# Patient Record
Sex: Female | Born: 1970
Health system: Southern US, Community
[De-identification: ages and names within clinical notes are randomized; demographics above are authoritative.]

## PROBLEM LIST (undated history)

## (undated) DIAGNOSIS — R202 Paresthesia of skin: Secondary | ICD-10-CM

## (undated) DIAGNOSIS — M069 Rheumatoid arthritis, unspecified: Secondary | ICD-10-CM

## (undated) DIAGNOSIS — R200 Anesthesia of skin: Secondary | ICD-10-CM

## (undated) DIAGNOSIS — T753XXA Motion sickness, initial encounter: Secondary | ICD-10-CM

## (undated) DIAGNOSIS — R42 Dizziness and giddiness: Secondary | ICD-10-CM

## (undated) DIAGNOSIS — M7062 Trochanteric bursitis, left hip: Secondary | ICD-10-CM

## (undated) HISTORY — DX: Trochanteric bursitis, left hip: M70.62

## (undated) HISTORY — PX: BUNIONECTOMY: SHX129

## (undated) HISTORY — DX: Anesthesia of skin: R20.0

## (undated) HISTORY — PX: TONSILLECTOMY: SUR1361

## (undated) HISTORY — DX: Paresthesia of skin: R20.2

---
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
## 2001-07-11 HISTORY — PX: ABDOMINAL HYSTERECTOMY: SHX81

## 2012-09-29 ENCOUNTER — Emergency Department: Payer: Self-pay | Admitting: Emergency Medicine

## 2015-02-13 ENCOUNTER — Ambulatory Visit (INDEPENDENT_AMBULATORY_CARE_PROVIDER_SITE_OTHER): Payer: BLUE CROSS/BLUE SHIELD | Admitting: Family Medicine

## 2015-02-13 ENCOUNTER — Ambulatory Visit
Admission: RE | Admit: 2015-02-13 | Discharge: 2015-02-13 | Disposition: A | Discharge status: Home / Self Care | Payer: BLUE CROSS/BLUE SHIELD | Source: Ambulatory Visit | Attending: Family Medicine | Admitting: Family Medicine

## 2015-02-13 ENCOUNTER — Encounter: Payer: Self-pay | Admitting: Family Medicine

## 2015-02-13 VITALS — BP 110/80 | HR 78 | Temp 98.3°F | Resp 16 | Ht 65.0 in | Wt 150.0 lb

## 2015-02-13 DIAGNOSIS — X58XXXA Exposure to other specified factors, initial encounter: Secondary | ICD-10-CM | POA: Insufficient documentation

## 2015-02-13 DIAGNOSIS — S8992XA Unspecified injury of left lower leg, initial encounter: Secondary | ICD-10-CM

## 2015-02-13 MED ORDER — HYDROCODONE-ACETAMINOPHEN 5-325 MG PO TABS: ORAL_TABLET | ORAL | Status: DC

## 2015-02-13 NOTE — Progress Notes (Signed)
Subjective:     Patient ID: Janice Strickland, female   DOB: 10-Mar-1971, 44 y.o.   MRN: [id number]  HPI  Chief Complaint  Patient presents with  . Fall    Patient comes in office today with concerns of knee pain. Patient states this morning she went to take her puppies outside and had slipped into her husbands flip flops, when patient tried exiting house with dogs she slipped out of flip flops and landed on her knee onto the concrete. Patient reports that pain is mostly in her left knee and is radiating down into her ankle.   States she landed directly on her left knee. "I tripped over a flip-flop then I found myself on the floor." has taken ibuprofen 400 mg.for her sx.   Review of Systems     Objective:   Physical Exam  Constitutional: She appears well-developed and well-nourished. She appears distressed (moderate pain with w.b.).  Musculoskeletal:  Antalgic gait wit mild swelling over her left knee. She is tender over her patella with crepitus present. Does not tolerate further manipulation/testing.       Assessment:    1. Knee injury, left, initial encounter: r/o fracture - DG Knee Complete 4 Views Left; Future - HYDROcodone-acetaminophen (NORCO/VICODIN) 5-325 MG per tablet; One every 4-6 hours as needed for pain  Dispense: 28 tablet; Refill: 0    Plan:    Discussed use of nsaid's, icing, and further f/u pending x-ray report. Rx for crutches provided.

## 2015-02-13 NOTE — Patient Instructions (Signed)
Continue the ibuprofen 400-800 mg. 3 x day with food. Ice down your knee for 10-20 minutes several times in the next 48 hours. We will call you with the x-ray report.

## 2015-07-01 ENCOUNTER — Ambulatory Visit (INDEPENDENT_AMBULATORY_CARE_PROVIDER_SITE_OTHER): Payer: BLUE CROSS/BLUE SHIELD | Admitting: Family Medicine

## 2015-07-01 ENCOUNTER — Encounter: Payer: Self-pay | Admitting: Family Medicine

## 2015-07-01 VITALS — BP 110/68 | HR 76 | Temp 98.1°F | Resp 16 | Ht 65.0 in | Wt 159.0 lb

## 2015-07-01 DIAGNOSIS — B349 Viral infection, unspecified: Secondary | ICD-10-CM | POA: Insufficient documentation

## 2015-07-01 DIAGNOSIS — R5383 Other fatigue: Secondary | ICD-10-CM | POA: Insufficient documentation

## 2015-07-01 DIAGNOSIS — N951 Menopausal and female climacteric states: Secondary | ICD-10-CM | POA: Insufficient documentation

## 2015-07-01 DIAGNOSIS — L509 Urticaria, unspecified: Secondary | ICD-10-CM

## 2015-07-01 DIAGNOSIS — M7060 Trochanteric bursitis, unspecified hip: Secondary | ICD-10-CM | POA: Insufficient documentation

## 2015-07-01 MED ORDER — RANITIDINE HCL 150 MG PO TABS: 150.0000 mg | ORAL_TABLET | Freq: Two times a day (BID) | ORAL | Status: DC

## 2015-07-01 MED ORDER — CETIRIZINE HCL 10 MG PO TABS: 10.0000 mg | ORAL_TABLET | Freq: Every day | ORAL | Status: DC

## 2015-07-01 NOTE — Progress Notes (Signed)
Patient ID: Janice Strickland, female   DOB: [birth year], 44 y.o.   MRN: [id number]       [name] Female    DOB: Feb 21, 1971   44 y.o.   MRN: [id number] Visit Date: 07/01/2015  Today's Provider: Lorie Phenix, MD   Chief Complaint  Patient presents with  . Rash   Subjective:    HPI Rash: Patient complains of rash involving the left side of neck and chest and under left arm. Rash started 1 days ago. Appearance of rash at onset: Color of lesion(s): pink. Rash has changed over time Initial distribution: neck.  Discomfort associated with rash: is painful and is pruritic.  Associated symptoms: headache and vision change. Patient also complains of body ache and stiffnes. Denies: fever. Patient has had similar rash. Patient has not identified precipitant. Patient has not had new exposures (soaps, lotions, laundry detergents, foods, medications, plants, insects or animals.) Patient reports she did have shingles about 4 years ago.    No Known Allergies Previous Medications   No medications on file    Review of Systems  Constitutional: Positive for fatigue.  HENT: Positive for congestion.   Eyes: Positive for visual disturbance.  Musculoskeletal: Positive for myalgias.  Skin: Positive for rash.  Neurological: Positive for headaches.    Social History  Substance Use Topics  . Smoking status: Never Smoker   . Smokeless tobacco: Never Used  . Alcohol Use: No   Objective:   BP 110/68 mmHg  Pulse 76  Temp(Src) 98.1 F (36.7 C) (Oral)  Resp 16  Ht 5\' 5"  (1.651 m)  Wt 159 lb (72.122 kg)  BMI 26.46 kg/m2  SpO2 98%  LMP  (Approximate)  Physical Exam  Constitutional: She appears well-developed and well-nourished.  HENT:  Right Ear: External ear normal.  Left Ear: External ear normal.  Nose: Nose normal.  Skin: Rash noted.  Erythema around left side of neck and chest      Assessment & Plan:     1. Urticaria New problem. Will start medication as below. Continue  to monitor.  Patient instructed to call back if condition worsens or does not improve.    - cetirizine (ZYRTEC) 10 MG tablet; Take 1 tablet (10 mg total) by mouth daily.  Dispense: 30 tablet; Refill: 11 - ranitidine (ZANTAC) 150 MG tablet; Take 1 tablet (150 mg total) by mouth 2 (two) times daily.  Dispense: 30 tablet; Refill: 0     Patient was seen and examined by , MD, and note scribed by Leo Grosser, CMA.   I have reviewed the document for accuracy and completeness and I agree with above. - Rondel Baton, MD  Leo Grosser, MD  [facility]

## 2016-09-29 ENCOUNTER — Encounter: Payer: Self-pay | Admitting: Advanced Practice Midwife

## 2016-09-29 ENCOUNTER — Ambulatory Visit (INDEPENDENT_AMBULATORY_CARE_PROVIDER_SITE_OTHER): Payer: BLUE CROSS/BLUE SHIELD | Admitting: Advanced Practice Midwife

## 2016-09-29 VITALS — BP 122/80 | HR 91 | Ht 64.0 in | Wt 162.0 lb

## 2016-09-29 DIAGNOSIS — Z01419 Encounter for gynecological examination (general) (routine) without abnormal findings: Secondary | ICD-10-CM

## 2016-09-29 DIAGNOSIS — R635 Abnormal weight gain: Secondary | ICD-10-CM

## 2016-09-29 DIAGNOSIS — Z124 Encounter for screening for malignant neoplasm of cervix: Secondary | ICD-10-CM | POA: Diagnosis not present

## 2016-09-29 DIAGNOSIS — R5383 Other fatigue: Secondary | ICD-10-CM

## 2016-09-29 NOTE — Progress Notes (Addendum)
Patient ID: Janice Strickland, female   DOB: Sep 12, 1970, 46 y.o.   MRN: [id number]     Gynecology Annual Exam  PCP: Mila Merry, MD  Chief Complaint:  Chief Complaint  Patient presents with  . Gynecologic Exam     weight gain/can't concentrate    History of Present Illness: Patient is a 46 y.o. G3P3003 presents for annual exam. The patient has complaints of weight gain over the past year, decreased concentration and decreased libido.   LMP: No LMP recorded. Patient has had a hysterectomy. Average Interval: NA Postcoital Bleeding: no  The patient is sexually active. She currently uses None for contraception. She admits to dyspareunia/vaginal dryness.  She admits occasional hot flashes. The patient does perform self breast exams.  There is no notable family history of breast or ovarian cancer in her family.  The patient wears seatbelts: yes.   The patient has regular exercise: no.    The patient denies current symptoms of depression.    Review of Systems: ROS  Past Medical History:  Past Medical History:  Diagnosis Date  . Trochanteric bursitis of left hip     Past Surgical History:  Past Surgical History:  Procedure Laterality Date  . ABDOMINAL HYSTERECTOMY  2003    Gynecologic History:  No LMP recorded. Patient has had a hysterectomy. Contraception: status post hysterectomy Last Pap: Results were: no abnormalities  Last mammogram: 2015 Results were: normal Obstetric History: G3P3003  Family History:  Family History  Problem Relation Age of Onset  . Hypertension Mother   . Hypothyroidism Mother   . Heart disease Mother   . Healthy Father   . Hypertension Brother     Social History:  Social History   Social History  . Marital status: Married    Spouse name: N/A  . Number of children: N/A  . Years of education: N/A   Occupational History  . Not on file.   Social History Main Topics  . Smoking status: Never Smoker  . Smokeless tobacco: Never Used    . Alcohol use No  . Drug use: No  . Sexual activity: Yes    Birth control/ protection: Surgical     Comment: Hysterectomy   Other Topics Concern  . Not on file   Social History Narrative  . No narrative on file    Allergies:  No Known Allergies  Medications: Prior to Admission medications   Not on File    Physical Exam Vitals: Blood pressure 122/80, pulse 91, height 5\' 4"  (1.626 m), weight 162 lb (73.5 kg).  General: NAD HEENT: normocephalic, anicteric Thyroid: no enlargement, no palpable nodules Pulmonary: No increased work of breathing, CTAB Cardiovascular: RRR, distal pulses 2+ Breast: Breast symmetrical, no tenderness, no palpable nodules or masses, no skin or nipple retraction present, no nipple discharge.  No axillary or supraclavicular lymphadenopathy. Abdomen: NABS, soft, non-tender, non-distended.  Umbilicus without lesions.  No hepatomegaly, splenomegaly or masses palpable. No evidence of hernia  Genitourinary:  External: Normal external female genitalia.  Normal urethral meatus, normal  Bartholin's and Skene's glands.    Vagina: Normal vaginal mucosa, no evidence of prolapse.    Cervix: S/P hysterectomy  Uterus: S/P hysterectomy  Adnexa: ovaries non-enlarged, no adnexal masses  Rectal: deferred  Lymphatic: no evidence of inguinal lymphadenopathy Extremities: no edema, erythema, or tenderness Neurologic: Grossly intact Psychiatric: mood appropriate, affect full    Assessment: 46 y.o. 46 well woman with PAP smear   Plan: Problem List Items Addressed This Visit  Other   Fatigue   Relevant Orders   TSH + free T4 (Completed)    Other Visit Diagnoses    Encounter for gynecological examination without abnormal finding    -  Primary   Relevant Orders   Pap IG (Image Guided)   TSH + free T4 (Completed)   Weight gain       Relevant Orders   TSH + free T4 (Completed)   Papanicolaou smear       Relevant Orders   Pap IG (Image Guided)       1) Mammogram - recommend yearly screening mammogram.    2) Thyroid screening today due to patient symptoms and family history  3) Discussed s/s of menopause and options for treatment  4) Hysterectomy PAP done today  5) Healthy lifestyle encouraged diet and increased exercise  6) Return to clinic in 1 year for annual gyn exam   Tresea Mall, CNM

## 2016-09-29 NOTE — Patient Instructions (Signed)
Menopause Menopause is the normal time of life when menstrual periods stop completely. Menopause is complete when you have missed 12 consecutive menstrual periods. It usually occurs between the ages of 48 years and 55 years. Very rarely does a woman develop menopause before the age of 40 years. At menopause, your ovaries stop producing the female hormones estrogen and progesterone. This can cause undesirable symptoms and also affect your health. Sometimes the symptoms may occur 4-5 years before the menopause begins. There is no relationship between menopause and:  Oral contraceptives.  Number of children you had.  Race.  The age your menstrual periods started (menarche).  Heavy smokers and very thin women may develop menopause earlier in life. What are the causes?  The ovaries stop producing the female hormones estrogen and progesterone. Other causes include:  Surgery to remove both ovaries.  The ovaries stop functioning for no known reason.  Tumors of the pituitary gland in the brain.  Medical disease that affects the ovaries and hormone production.  Radiation treatment to the abdomen or pelvis.  Chemotherapy that affects the ovaries.  What are the signs or symptoms?  Hot flashes.  Night sweats.  Decrease in sex drive.  Vaginal dryness and thinning of the vagina causing painful intercourse.  Dryness of the skin and developing wrinkles.  Headaches.  Tiredness.  Irritability.  Memory problems.  Weight gain.  Bladder infections.  Hair growth of the face and chest.  Infertility. More serious symptoms include:  Loss of bone (osteoporosis) causing breaks (fractures).  Depression.  Hardening and narrowing of the arteries (atherosclerosis) causing heart attacks and strokes.  How is this diagnosed?  When the menstrual periods have stopped for 12 straight months.  Physical exam.  Hormone studies of the blood. How is this treated? There are many treatment  choices and nearly as many questions about them. The decisions to treat or not to treat menopausal changes is an individual choice made with your health care provider. Your health care provider can discuss the treatments with you. Together, you can decide which treatment will work best for you. Your treatment choices may include:  Hormone therapy (estrogen and progesterone).  Non-hormonal medicines.  Treating the individual symptoms with medicine (for example antidepressants for depression).  Herbal medicines that may help specific symptoms.  Counseling by a psychiatrist or psychologist.  Group therapy.  Lifestyle changes including: ? Eating healthy. ? Regular exercise. ? Limiting caffeine and alcohol. ? Stress management and meditation.  No treatment.  Follow these instructions at home:  Take the medicine your health care provider gives you as directed.  Get plenty of sleep and rest.  Exercise regularly.  Eat a diet that contains calcium (good for the bones) and soy products (acts like estrogen hormone).  Avoid alcoholic beverages.  Do not smoke.  If you have hot flashes, dress in layers.  Take supplements, calcium, and vitamin D to strengthen bones.  You can use over-the-counter lubricants or moisturizers for vaginal dryness.  Group therapy is sometimes very helpful.  Acupuncture may be helpful in some cases. Contact a health care provider if:  You are not sure you are in menopause.  You are having menopausal symptoms and need advice and treatment.  You are still having menstrual periods after age 55 years.  You have pain with intercourse.  Menopause is complete (no menstrual period for 12 months) and you develop vaginal bleeding.  You need a referral to a specialist (gynecologist, psychiatrist, or psychologist) for treatment. Get help right   away if:  You have severe depression.  You have excessive vaginal bleeding.  You fell and think you have a  broken bone.  You have pain when you urinate.  You develop leg or chest pain.  You have a fast pounding heart beat (palpitations).  You have severe headaches.  You develop vision problems.  You feel a lump in your breast.  You have abdominal pain or severe indigestion. This information is not intended to replace advice given to you by your health care provider. Make sure you discuss any questions you have with your health care provider. Document Released: 09/17/2003 Document Revised: 12/03/2015 Document Reviewed: 01/24/2013 Elsevier Interactive Patient Education  2017 Elsevier Inc.  

## 2016-09-30 LAB — PAP IG (IMAGE GUIDED): PAP Smear Comment: 0

## 2016-09-30 LAB — TSH+FREE T4
Free T4: 1.13 ng/dL (ref 0.82–1.77)
TSH: 2.1 u[IU]/mL (ref 0.450–4.500)

## 2016-11-21 ENCOUNTER — Ambulatory Visit (INDEPENDENT_AMBULATORY_CARE_PROVIDER_SITE_OTHER): Payer: BLUE CROSS/BLUE SHIELD | Admitting: Physician Assistant

## 2016-11-21 ENCOUNTER — Encounter: Payer: Self-pay | Admitting: Physician Assistant

## 2016-11-21 VITALS — BP 122/80 | HR 76 | Temp 98.1°F | Resp 16 | Wt 164.0 lb

## 2016-11-21 DIAGNOSIS — M654 Radial styloid tenosynovitis [de Quervain]: Secondary | ICD-10-CM | POA: Diagnosis not present

## 2016-11-21 DIAGNOSIS — L237 Allergic contact dermatitis due to plants, except food: Secondary | ICD-10-CM | POA: Diagnosis not present

## 2016-11-21 DIAGNOSIS — M25521 Pain in right elbow: Secondary | ICD-10-CM | POA: Diagnosis not present

## 2016-11-21 MED ORDER — TRIAMCINOLONE ACETONIDE 0.1 % EX CREA: 1.0000 "application " | TOPICAL_CREAM | Freq: Two times a day (BID) | CUTANEOUS | 0 refills | Status: DC

## 2016-11-21 NOTE — Progress Notes (Signed)
[name] Female    DOB: [birth year]   46 y.o.   MRN: [id number] Visit Date: 11/21/2016  Today's Provider: Trey Sailors, PA-C   Chief Complaint  Patient presents with  . Rash    Started last week.  . Elbow Pain    Right Elbow pain also started last week.    Subjective:    Rash  This is a new problem. The current episode started 1 to 4 weeks ago. The problem has been gradually worsening since onset. The rash is diffuse. The rash is characterized by itchiness and redness. She was exposed to plant contact.  Arm Pain   The incident occurred more than 1 week ago. The incident occurred at home. There was no injury mechanism. The pain is present in the right elbow. The quality of the pain is described as aching. The pain radiates to the right arm. The pain is at a severity of 3/10. The pain is mild. The pain has been constant since the incident. Associated symptoms include muscle weakness. Pertinent negatives include no chest pain or numbness. Nothing aggravates the symptoms. She has tried acetaminophen for the symptoms. The treatment provided mild relief.       No Known Allergies  No current outpatient prescriptions on file.  Review of Systems  Cardiovascular: Negative for chest pain.  Musculoskeletal: Positive for arthralgias and myalgias. Negative for back pain, gait problem, joint swelling, neck pain and neck stiffness.  Skin: Positive for rash. Negative for color change, pallor and wound.  Neurological: Positive for weakness. Negative for dizziness, numbness and headaches.    Social History  Substance Use Topics  . Smoking status: Never Smoker  . Smokeless tobacco: Never Used  . Alcohol use No   Objective:   BP 122/80 (BP Location: Left Arm, Patient Position: Sitting, Cuff Size: Normal)   Pulse 76   Temp 98.1 F (36.7 C) (Oral)   Resp 16   Wt 164 lb (74.4 kg)   BMI 28.15 kg/m  Vitals:   11/21/16 1345  BP: 122/80  Pulse: 76  Resp: 16  Temp:  98.1 F (36.7 C)  TempSrc: Oral  Weight: 164 lb (74.4 kg)     Physical Exam  Constitutional: She is oriented to person, place, and time. She appears well-developed and well-nourished.  Cardiovascular: Normal rate and regular rhythm.   Pulmonary/Chest: Effort normal and breath sounds normal.  Musculoskeletal: Normal range of motion. She exhibits tenderness. She exhibits no edema or deformity.  Grip strength 4/5 on right, 5/5 on left. Positive Finkelstein's on right. Some TTP along right radial styloid and proximally. Some TTP along lateral right forearm. Full ROM.   Neurological: She is alert and oriented to person, place, and time.  Skin: Skin is warm and dry. Rash noted. There is erythema.  Vesicular lesions and erythema scattered on extremities and trunk. No facial or ocular involvement.        Assessment & Plan:     1. Allergic contact dermatitis due to plants, except food  Do not apply to face or genitals.  - triamcinolone cream (KENALOG) 0.1 %; Apply 1 application topically 2 (two) times daily.  Dispense: 30 g; Refill: 0  2. Right elbow pain  Seems like tendonitis, patient recently doing yard work. Should rest, use ICE/NSAIDs.  3. De Quervain's disease (tenosynovitis)  Talked about rest, ice, NSAIDs. Also talked about thumb spica splint and ortho referral if pain persists.  Return if symptoms worsen  or fail to improve.  The entirety of the information documented in the History of Present Illness, Review of Systems and Physical Exam were personally obtained by me. Portions of this information were initially documented by Kavin Leech, CMA and reviewed by me for thoroughness and accuracy.        Trey Sailors, PA-C  [facility]

## 2016-11-21 NOTE — Patient Instructions (Signed)
Poison Oak Dermatitis Poison oak dermatitis is redness and soreness (inflammation) of the skin. It is caused by a chemical that is on the leaves of the poison oak plant. You may also have itching, a rash, and blisters. Symptoms often clear up in 1-2 weeks. You may get this condition by touching a poison oak plant. You can also get it by touching something that has the chemical on it. This may include animals or objects that have come in contact with the plant. Follow these instructions at home: General instructions   Take or apply over-the-counter and prescription medicines only as told by your doctor.  If you touch poison oak, wash your skin with soap and cold water right away.  Use hydrocortisone creams or calamine lotion as needed to help with itching.  Take oatmeal baths as needed. Use colloidal oatmeal. You can get this at a pharmacy or grocery store. Follow the instructions on the package.  Do not scratch or rub your skin.  While you have the rash, wash your clothes right after you wear them. Prevention    Know what poison oak looks like so you can avoid it. This plant has three leaves with flowering branches on a single stem. The leaves are fuzzy. They have a toothlike edge.  If you have touched poison oak, wash with soap and water right away. Be sure to wash under your fingernails.  When hiking or camping, wear long pants, a long-sleeved shirt, tall socks, and hiking boots. You can also use a lotion on your skin that helps to prevent contact with the chemical on the plant.  If you think that your clothes or outdoor gear came in contact with poison oak, rinse them off with a garden hose before you bring them inside your house. Contact a doctor if:  You have open sores in the rash area.  You have more redness, swelling, or pain in the affected area.  You have redness that spreads beyond the rash area.  You have fluid, blood, or pus coming from the affected area.  You have a  fever.  You have a rash over a large area of your body.  You have a rash on your eyes, mouth, or genitals.  Your rash does not improve after a few days. Get help right away if:  Your face swells or your eyes swell shut.  You have trouble breathing.  You have trouble swallowing. This information is not intended to replace advice given to you by your health care provider. Make sure you discuss any questions you have with your health care provider. Document Released: 07/30/2010 Document Revised: 12/03/2015 Document Reviewed: 12/03/2014 Elsevier Interactive Patient Education  2017 Elsevier Inc.  

## 2018-02-20 ENCOUNTER — Ambulatory Visit (INDEPENDENT_AMBULATORY_CARE_PROVIDER_SITE_OTHER): Payer: Commercial Managed Care - PPO | Admitting: Obstetrics and Gynecology

## 2018-02-20 ENCOUNTER — Encounter: Payer: Self-pay | Admitting: Obstetrics and Gynecology

## 2018-02-20 VITALS — BP 110/80 | HR 75 | Ht 64.0 in | Wt 151.0 lb

## 2018-02-20 DIAGNOSIS — N951 Menopausal and female climacteric states: Secondary | ICD-10-CM

## 2018-02-20 DIAGNOSIS — Z1239 Encounter for other screening for malignant neoplasm of breast: Secondary | ICD-10-CM

## 2018-02-20 DIAGNOSIS — F411 Generalized anxiety disorder: Secondary | ICD-10-CM | POA: Diagnosis not present

## 2018-02-20 DIAGNOSIS — Z01419 Encounter for gynecological examination (general) (routine) without abnormal findings: Secondary | ICD-10-CM | POA: Diagnosis not present

## 2018-02-20 DIAGNOSIS — Z1231 Encounter for screening mammogram for malignant neoplasm of breast: Secondary | ICD-10-CM

## 2018-02-20 MED ORDER — HYDROXYZINE HCL 25 MG PO TABS: 25.0000 mg | ORAL_TABLET | Freq: Four times a day (QID) | ORAL | 2 refills | Status: DC | PRN

## 2018-02-20 MED ORDER — ESCITALOPRAM OXALATE 10 MG PO TABS: 10.0000 mg | ORAL_TABLET | Freq: Every day | ORAL | 2 refills | Status: DC

## 2018-02-20 NOTE — Patient Instructions (Signed)
[facility] [address] [place] [address]  [facility] [place]  [address] [place]  [address]  Phone: [phone]  We discussed WHI study findings in detail.  In the combined estrogen-progesterone arm breast cancer risk was increased by 1.26 (CI of 1.00 to 1.59), coronary heart disease 1.29 (CI 1.02-1.63), stroke risk 1.41 (1.07-1.85), and pulmonary embolism 2.13 (CI 1.39-3.25).  That being said the while statistically significant the actual number of cases attributable are relatively small at an addition 8 cases of breast cancer, 7 more coronary artery event, 8 more strokes, and 8 additional case of pulmonary embolism per 10,000 women.  Study was terminated because of the increased breast cancer risk, this was not seen in the progestin only arm of the study for women without an intact uterus.  In addition it is important to note that HRT also had positive or risk reducing effects, and all cause mortality between the HRT/non-HRT users is not statistically different.  Estrogen-progestin HRT decreased the relative risk of hip fracture 0.66 (CI 0.45-0.98), colorectal cancer 0.63 (0.43-0.92).  Current consensus is to limit dose to the lowest effective dose, and shortest treatment duration possible.  Breast cancer risk appeared to increase after 4 years of use.  Also important to note is that these risk refer to systemic HRT for the treatment of vasomotor symptoms, and do not apply to vaginal preperations with minimal systemic absorption and aimed at treating symptoms of vulvovaginal atrophy.    We briefly touched on findings of WHIMS trial published in 2005 which looked at women 65 year of age or older, and whether HRT was protective against the development of dementia.  The study revealed that HRT actually increased the risk for the development of dementia but was limited by looking only at patients 65 years of age and older.  The subsequent KEEPS trial  In 2012 which  looked at HRT in recently postmenopausal women did not show any improvement in cognitive function for women on HRT.  However, there was also no significant cognitive declines seen in recently postmenopausal women receiving HRT as had previously been shown in the WHIMS trial.    

## 2018-02-20 NOTE — Progress Notes (Signed)
Gynecology Annual Exam  PCP: Malva Limes, MD  Chief Complaint:  Chief Complaint  Patient presents with  . Gynecologic Exam    Menopausal S&S  . Anxiety    History of Present Illness: Patient is a 47 y.o. G3P3003 presents for annual exam. The patient has no complaints today.   LMP: No LMP recorded. Patient has had a hysterectomy.  The patient is sexually active. She currently uses status post hysterectomy for contraception. She denies dyspareunia.  The patient does perform self breast exams.  There is no notable family history of breast or ovarian cancer in her family.  The patient wears seatbelts: no.   The patient has regular exercise: not asked.    The patient reports current symptoms of anxiety.    The patient is a 47 y.o. female presenting initial evaluation for symptoms of anxiety and depression.  The patient is currently taking nothing for the management of her symptoms.  She has not had any recent situational stressors.  She reports symptoms of anhedonia, day time somnolence, insomnia, irritability and social anxiety.  She denies risk taking behavior, agorophobia, feelings of guilt, feelings of worthlessness, suicidal ideation, homicidal ideation, auditory hallucinations and visual hallucinations.    The patient does not have a pre-existing history of depression and anxiety.  She  does not a prior history of suicide attempts.   Review of Systems: Review of Systems  Constitutional: Negative for chills and fever.  HENT: Negative for congestion.   Respiratory: Negative for cough and shortness of breath.   Cardiovascular: Negative for chest pain and palpitations.  Gastrointestinal: Negative for abdominal pain, constipation, diarrhea, heartburn, nausea and vomiting.  Genitourinary: Negative for dysuria, frequency and urgency.  Skin: Negative for itching and rash.  Neurological: Negative for dizziness and headaches.  Endo/Heme/Allergies: Negative for polydipsia.    Psychiatric/Behavioral: Negative for depression, hallucinations, memory loss, substance abuse and suicidal ideas. The patient is nervous/anxious and has insomnia.     Past Medical History:  Past Medical History:  Diagnosis Date  . Trochanteric bursitis of left hip     Past Surgical History:  Past Surgical History:  Procedure Laterality Date  . ABDOMINAL HYSTERECTOMY  2003    Gynecologic History:  No LMP recorded. Patient has had a hysterectomy. Contraception: status post hysterectomy Last Pap: Results were: N/A  Obstetric History: G3P3003  Family History:  Family History  Problem Relation Age of Onset  . Hypertension Mother   . Hypothyroidism Mother   . Heart disease Mother   . Healthy Father   . Hypertension Brother     Social History:  Social History   Socioeconomic History  . Marital status: Married    Spouse name: Not on file  . Number of children: Not on file  . Years of education: Not on file  . Highest education level: Not on file  Occupational History  . Not on file  Social Needs  . Financial resource strain: Not on file  . Food insecurity:    Worry: Not on file    Inability: Not on file  . Transportation needs:    Medical: Not on file    Non-medical: Not on file  Tobacco Use  . Smoking status: Never Smoker  . Smokeless tobacco: Never Used  Substance and Sexual Activity  . Alcohol use: No    Alcohol/week: 0.0 standard drinks  . Drug use: No  . Sexual activity: Yes    Birth control/protection: Surgical    Comment: Hysterectomy  Lifestyle  . Physical activity:    Days per week: Not on file    Minutes per session: Not on file  . Stress: Not on file  Relationships  . Social connections:    Talks on phone: Not on file    Gets together: Not on file    Attends religious service: Not on file    Active member of club or organization: Not on file    Attends meetings of clubs or organizations: Not on file    Relationship status: Not on file  .  Intimate partner violence:    Fear of current or ex partner: Not on file    Emotionally abused: Not on file    Physically abused: Not on file    Forced sexual activity: Not on file  Other Topics Concern  . Not on file  Social History Narrative  . Not on file    Allergies:  No Known Allergies  Medications: Prior to Admission medications   Not on File    Physical Exam Vitals: Blood pressure 110/80, pulse 75, height 5\' 4"  (1.626 m), weight 151 lb (68.5 kg).  General: NAD HEENT: normocephalic, anicteric Thyroid: no enlargement, no palpable nodules Pulmonary: No increased work of breathing, CTAB Cardiovascular: RRR, distal pulses 2+ Breast: Breast symmetrical, no tenderness, no palpable nodules or masses, no skin or nipple retraction present, no nipple discharge.  No axillary or supraclavicular lymphadenopathy. Abdomen: NABS, soft, non-tender, non-distended.  Umbilicus without lesions.  No hepatomegaly, splenomegaly or masses palpable. No evidence of hernia  Genitourinary:  External: Normal external female genitalia.  Normal urethral meatus, normal Bartholin's and Skene's glands.    Vagina: Normal vaginal mucosa, no evidence of prolapse.    Cervix: surgically absent  Uterus: surgically absent  Adnexa: ovaries non-enlarged, no adnexal masses  Rectal: deferred  Lymphatic: no evidence of inguinal lymphadenopathy Extremities: no edema, erythema, or tenderness Neurologic: Grossly intact Psychiatric: mood appropriate, affect full  Female chaperone present for pelvic and breast  portions of the physical exam  GAD 7 : Generalized Anxiety Score 02/20/2018  Nervous, Anxious, on Edge 2  Control/stop worrying 2  Worry too much - different things 1  Trouble relaxing 2  Restless 2  Easily annoyed or irritable 3  Afraid - awful might happen 3  Total GAD 7 Score 15  Anxiety Difficulty Somewhat difficult        Assessment: 47 y.o. G3P3003 routine annual exam  Plan: Problem List  Items Addressed This Visit    None    Visit Diagnoses    Encounter for gynecological examination without abnormal finding    -  Primary   Relevant Orders   Follicle stimulating hormone (Completed)   Estradiol (Completed)   Breast screening       Relevant Orders   MM 3D SCREEN BREAST BILATERAL   Perimenopausal vasomotor symptoms       Relevant Orders   Follicle stimulating hormone (Completed)   Estradiol (Completed)   Generalized anxiety disorder       Relevant Medications   escitalopram (LEXAPRO) 10 MG tablet   hydrOXYzine (ATARAX/VISTARIL) 25 MG tablet      1) Mammogram - recommend yearly screening mammogram.  Mammogram Was ordered today   2) STI screening  was notoffered and therefore not obtained  3) ASCCP guidelines and rational discussed.  Patient opts for discontinue secondary to prior hysterectomy screening interval  4) Contraception - the patient is currently using  status post hysterectomy.  She is not currently in  need of contraception secondary to being sterile  5) Colonoscopy - at age 73  6) Routine healthcare maintenance including cholesterol, diabetes screening discussed managed by PCP  7) Anxiety/Depression - start lexapro 10mg  and hydroxyzine 25mg  prn  8)Vasomotor symptoms - discussed average age of menopause 54.  Given prior hysterectomy unclear menopausal status.  We discussed that menopause may have mood manifestation for some individuals.  Will check FSH/LH.  If in the menopausal range we discussed HRT.  We discussed WHI study findings in detail.  In the combined estrogen-progesterone arm breast cancer risk was increased by 1.26 (CI of 1.00 to 1.59), coronary heart disease 1.29 (CI 1.02-1.63), stroke risk 1.41 (1.07-1.85), and pulmonary embolism 2.13 (CI 1.39-3.25).  That being said the while statistically significant the actual number of cases attributable are relatively small at an addition 8 cases of breast cancer, 7 more coronary artery event, 8 more  strokes, and 8 additional case of pulmonary embolism per 10,000 women.  Study was terminated because of the increased breast cancer risk, this was not seen in the progestin only arm of the study for women without an intact uterus.  In addition it is important to note that HRT also had positive or risk reducing effects, and all cause mortality between the HRT/non-HRT users is not statistically different.  Estrogen-progestin HRT decreased the relative risk of hip fracture 0.66 (CI 0.45-0.98), colorectal cancer 0.63 (0.43-0.92).  Current consensus is to limit dose to the lowest effective dose, and shortest treatment duration possible.  Breast cancer risk appeared to increase after 4 years of use.  Also important to note is that these risk refer to systemic HRT for the treatment of vasomotor symptoms, and do not apply to vaginal preperations with minimal systemic absorption and aimed at treating symptoms of vulvovaginal atrophy.    We briefly touched on findings of WHIMS trial published in 2005 which looked at women 8 year of age or older, and whether HRT was protective against the development of dementia.  The study revealed that HRT actually increased the risk for the development of dementia but was limited by looking only at patients 21 years of age and older.  The subsequent KEEPS trial  In 2012 which looked at HRT in recently postmenopausal women did not show any improvement in cognitive function for women on HRT.  However, there was also no significant cognitive declines seen in recently postmenopausal women receiving HRT as had previously been shown in the [facility] trial.    A total of 15 minutes were spent in face-to-face contact with the patient during this encounter with over half of that time devoted to counseling and coordination of care.   9) Return in about 4 weeks (around 03/20/2018) for medication follow up.   [facility], MD, 05/20/2018 OB/GYN, [facility]

## 2018-02-21 LAB — ESTRADIOL: Estradiol: 24.1 pg/mL

## 2018-02-21 LAB — FOLLICLE STIMULATING HORMONE: FSH: 116.4 m[IU]/mL

## 2018-02-23 ENCOUNTER — Ambulatory Visit
Admission: RE | Admit: 2018-02-23 | Discharge: 2018-02-23 | Disposition: A | Discharge status: Home / Self Care | Payer: Commercial Managed Care - PPO | Source: Ambulatory Visit | Attending: Obstetrics and Gynecology | Admitting: Obstetrics and Gynecology

## 2018-02-23 DIAGNOSIS — Z1239 Encounter for other screening for malignant neoplasm of breast: Secondary | ICD-10-CM

## 2018-02-23 DIAGNOSIS — Z1231 Encounter for screening mammogram for malignant neoplasm of breast: Secondary | ICD-10-CM | POA: Insufficient documentation

## 2018-02-26 ENCOUNTER — Other Ambulatory Visit: Payer: Self-pay | Admitting: Obstetrics and Gynecology

## 2018-02-26 MED ORDER — ESTROGENS CONJUGATED 0.625 MG PO TABS: 0.6250 mg | ORAL_TABLET | Freq: Every day | ORAL | 11 refills | Status: DC

## 2018-02-26 NOTE — Progress Notes (Signed)
Rx premarin po for vasomotor symptoms

## 2018-03-14 ENCOUNTER — Other Ambulatory Visit: Payer: Self-pay | Admitting: Obstetrics and Gynecology

## 2018-03-20 ENCOUNTER — Other Ambulatory Visit: Payer: Self-pay | Admitting: Obstetrics and Gynecology

## 2018-03-22 ENCOUNTER — Encounter: Payer: Self-pay | Admitting: Obstetrics and Gynecology

## 2018-03-22 ENCOUNTER — Ambulatory Visit (INDEPENDENT_AMBULATORY_CARE_PROVIDER_SITE_OTHER): Payer: Commercial Managed Care - PPO | Admitting: Obstetrics and Gynecology

## 2018-03-22 VITALS — BP 120/82 | HR 77 | Ht 64.0 in | Wt 153.0 lb

## 2018-03-22 DIAGNOSIS — N951 Menopausal and female climacteric states: Secondary | ICD-10-CM

## 2018-03-22 DIAGNOSIS — F411 Generalized anxiety disorder: Secondary | ICD-10-CM

## 2018-03-22 MED ORDER — ESCITALOPRAM OXALATE 20 MG PO TABS: 20.0000 mg | ORAL_TABLET | Freq: Every day | ORAL | 2 refills | Status: DC

## 2018-03-22 NOTE — Progress Notes (Signed)
Obstetrics & Gynecology Office Visit   Chief Complaint:  Chief Complaint  Patient presents with  . Follow-up    Medication Follow up    History of Present Illness: The patient is a 47 y.o. female presenting follow up for symptoms of anxiety.  The patient is currently taking lexapro 10mg  po daily for the management of her symptoms.  She has not had any recent situational stressors.  She reports symptoms of insomnia, irritability, social anxiety and agorophobia.  She denies suicidal ideation, homicidal ideation, auditory hallucinations and visual hallucinations. Symptoms have improved since last visit.     The patient does not have a pre-existing history of depression and anxiety.  She  does not a prior history of suicide attempts.   She was also started on premarin po last visit.  She states she has noted good overall improvement in vasomotor symptoms since starting premarin.    Review of Systems: Review of Systems  Constitutional: Negative.   Gastrointestinal: Negative for abdominal pain and nausea.  Neurological: Negative for dizziness.  Psychiatric/Behavioral: Negative for depression, hallucinations, substance abuse and suicidal ideas. The patient is nervous/anxious.     Past Medical History:  Past Medical History:  Diagnosis Date  . Trochanteric bursitis of left hip     Past Surgical History:  Past Surgical History:  Procedure Laterality Date  . ABDOMINAL HYSTERECTOMY  2003    Gynecologic History: No LMP recorded. Patient has had a hysterectomy.  Obstetric History: 2004  Family History:  Family History  Problem Relation Age of Onset  . Hypertension Mother   . Hypothyroidism Mother   . Heart disease Mother   . Healthy Father   . Hypertension Brother   . Breast cancer Neg Hx     Social History:  Social History   Socioeconomic History  . Marital status: Married    Spouse name: Not on file  . Number of children: Not on file  . Years of education: Not  on file  . Highest education level: Not on file  Occupational History  . Not on file  Social Needs  . Financial resource strain: Not on file  . Food insecurity:    Worry: Not on file    Inability: Not on file  . Transportation needs:    Medical: Not on file    Non-medical: Not on file  Tobacco Use  . Smoking status: Never Smoker  . Smokeless tobacco: Never Used  Substance and Sexual Activity  . Alcohol use: No    Alcohol/week: 0.0 standard drinks  . Drug use: No  . Sexual activity: Yes    Birth control/protection: Surgical    Comment: Hysterectomy  Lifestyle  . Physical activity:    Days per week: Not on file    Minutes per session: Not on file  . Stress: Not on file  Relationships  . Social connections:    Talks on phone: Not on file    Gets together: Not on file    Attends religious service: Not on file    Active member of club or organization: Not on file    Attends meetings of clubs or organizations: Not on file    Relationship status: Not on file  . Intimate partner violence:    Fear of current or ex partner: Not on file    Emotionally abused: Not on file    Physically abused: Not on file    Forced sexual activity: Not on file  Other Topics Concern  .  Not on file  Social History Narrative  . Not on file    Allergies:  No Known Allergies  Medications: Prior to Admission medications   Medication Sig Start Date End Date Taking? Authorizing Provider  escitalopram (LEXAPRO) 20 MG tablet Take 1 tablet (20 mg total) by mouth daily. 03/22/18  Yes Vena Austria, MD  hydrOXYzine (ATARAX/VISTARIL) 25 MG tablet Take 1 tablet (25 mg total) by mouth every 6 (six) hours as needed for anxiety. 02/20/18  Yes Vena Austria, MD  PREMARIN 0.625 MG tablet TAKE 1 TABLET (0.625 MG TOTAL) BY MOUTH DAILY. 03/20/18  Yes Vena Austria, MD    Physical Exam Vitals:  Vitals:   03/22/18 1004  BP: 120/82  Pulse: 77   No LMP recorded. Patient has had a  hysterectomy.  General: NAD HEENT: normocephalic, anicteric Pulmonary: No increased work of breathing Neurologic: Grossly intact Psychiatric: mood appropriate, affect full    GAD 7 : Generalized Anxiety Score 03/22/2018 02/20/2018  Nervous, Anxious, on Edge 3 2  Control/stop worrying 3 2  Worry too much - different things 3 1  Trouble relaxing 3 2  Restless 2 2  Easily annoyed or irritable 3 3  Afraid - awful might happen 3 3  Total GAD 7 Score 20 15  Anxiety Difficulty Somewhat difficult Somewhat difficult    Depression screen PHQ 2/9 03/22/2018  Decreased Interest 1  Down, Depressed, Hopeless 1  PHQ - 2 Score 2  Altered sleeping 1  Tired, decreased energy 1  Change in appetite 1  Feeling bad or failure about yourself  0  Trouble concentrating 2  Moving slowly or fidgety/restless 2  Suicidal thoughts 0  PHQ-9 Score 9  Difficult doing work/chores Somewhat difficult    Depression screen PHQ 2/9 03/22/2018  Decreased Interest 1  Down, Depressed, Hopeless 1  PHQ - 2 Score 2  Altered sleeping 1  Tired, decreased energy 1  Change in appetite 1  Feeling bad or failure about yourself  0  Trouble concentrating 2  Moving slowly or fidgety/restless 2  Suicidal thoughts 0  PHQ-9 Score 9  Difficult doing work/chores Somewhat difficult    Assessment: 47 y.o. [id number] presenting for medication follow up anxiety and vasomotor symptoms  Plan: Problem List Items Addressed This Visit    None      1) Anxiety - still not well controlled, will increase Lexapro 20 mg daily.  Depending on results consider adding Buspar  -GAD-7 is 20 -PHQ-9 is 9 item 9 is 0  2) Vasomotor symptoms - good response continue premarin at current dose  3)   Vena Austria, MD, [facility], [facility] 03/22/2018, 12:59 PM    Increase lexapro to 20mg  If uncomplete response add buspar     Doing well  premarin

## 2018-03-29 ENCOUNTER — Other Ambulatory Visit: Payer: Self-pay | Admitting: *Deleted

## 2018-03-29 ENCOUNTER — Inpatient Hospital Stay
Admission: RE | Admit: 2018-03-29 | Discharge: 2018-03-29 | Disposition: A | Discharge status: Home / Self Care | Payer: Self-pay | Source: Ambulatory Visit | Attending: *Deleted | Admitting: *Deleted

## 2018-03-29 DIAGNOSIS — Z9289 Personal history of other medical treatment: Secondary | ICD-10-CM

## 2018-04-14 ENCOUNTER — Other Ambulatory Visit: Payer: Self-pay | Admitting: Obstetrics and Gynecology

## 2018-04-16 ENCOUNTER — Ambulatory Visit (INDEPENDENT_AMBULATORY_CARE_PROVIDER_SITE_OTHER): Payer: Commercial Managed Care - PPO | Admitting: Family Medicine

## 2018-04-16 ENCOUNTER — Encounter: Payer: Self-pay | Admitting: Family Medicine

## 2018-04-16 VITALS — BP 90/60 | HR 64 | Temp 98.0°F | Resp 16 | Ht 64.0 in | Wt 155.4 lb

## 2018-04-16 DIAGNOSIS — Z0000 Encounter for general adult medical examination without abnormal findings: Secondary | ICD-10-CM

## 2018-04-16 DIAGNOSIS — F419 Anxiety disorder, unspecified: Secondary | ICD-10-CM

## 2018-04-16 DIAGNOSIS — M21611 Bunion of right foot: Secondary | ICD-10-CM

## 2018-04-16 DIAGNOSIS — Z23 Encounter for immunization: Secondary | ICD-10-CM

## 2018-04-16 DIAGNOSIS — L309 Dermatitis, unspecified: Secondary | ICD-10-CM

## 2018-04-16 MED ORDER — FLUOCINONIDE 0.05 % EX CREA: 1.0000 "application " | TOPICAL_CREAM | Freq: Three times a day (TID) | CUTANEOUS | 0 refills | Status: DC

## 2018-04-16 NOTE — Telephone Encounter (Signed)
Medication follow up scheduled on 10/11. Please advise

## 2018-04-16 NOTE — Progress Notes (Signed)
  Subjective:     Patient ID: Janice Strickland, female   DOB: Apr 30, 1971, 47 y.o.   MRN: [id number] Chief Complaint  Patient presents with  . Annual Exam   HPI She is followed by Dr. Chauncey Cruel, gynecology, who has been prescribing medication for anxiety. 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  Review of Systems General: Feeling well. Wishes to update immunizations HEENT: regular dental visits and eye exams (reading glasses). Reports hx of tonsil stones. Cardiovascular: no chest pain, shortness of breath, or palpitations GI: no heartburn, no change in bowel habits or blood in the stool. Reports chronic constipation controlled with otc laxatives. GU:, no change in bladder habits  Psychiatric: not depressed Musculoskeletal: painful right great toe bunion. Skin: Recurrent upper back rash previously treated with TAC cream with minimal improvement.    Objective:   Physical Exam  Constitutional: She appears well-developed and well-nourished. No distress.  Eyes: PERRLA, EOMI Neck: no thyromegaly, tenderness or nodules, no cervical adenopathy or carotid bruits ENT: TM's intact without inflammation; No tonsillar enlargement or exudate, no stones or erythema noted. Lungs: Clear Heart : RRR without murmur or gallop Abd: bowel sounds present, soft, non-tender, no organomegaly Extremities: no edema, right great toe bunion noted. Skin: a few scattered, excoriated papules on her back.     Assessment:    1. Need for influenza vaccination - Flu Vaccine QUAD 36+ mos IM  2. Need for tetanus, diphtheria, and acellular pertussis (Tdap) vaccine - Tdap vaccine greater than or equal to 7yo IM  3. Annual physical exam - Lipid panel - Comprehensive metabolic panel  4. Anxiety: continue Lexapro  5. Dermatitis - fluocinonide cream (LIDEX) 0.05 %; Apply 1 application topically 3 (three) times daily. To upper back rash as needed  Dispense: 60 g; Refill: 0  6. Bunion of great toe of right foot -  Ambulatory referral to Podiatry    Plan:    Further f/u pending lab results. Consider referral to ENT if tonsil stones recurrent. Dermatology referral if rash not improved.

## 2018-04-16 NOTE — Patient Instructions (Addendum)
We will call you with the lab results and the podiatry referral. Let us know if you wish referral to a dermatologist or ENT doctor about your tonsils.

## 2018-04-17 ENCOUNTER — Telehealth: Payer: Self-pay

## 2018-04-17 LAB — COMPREHENSIVE METABOLIC PANEL
A/G RATIO: 1.5 (ref 1.2–2.2)
ALBUMIN: 3.9 g/dL (ref 3.5–5.5)
ALT: 14 IU/L (ref 0–32)
AST: 18 IU/L (ref 0–40)
Alkaline Phosphatase: 72 IU/L (ref 39–117)
BUN/Creatinine Ratio: 15 (ref 9–23)
BUN: 13 mg/dL (ref 6–24)
Bilirubin Total: 0.4 mg/dL (ref 0.0–1.2)
CALCIUM: 9.2 mg/dL (ref 8.7–10.2)
CO2: 21 mmol/L (ref 20–29)
Chloride: 103 mmol/L (ref 96–106)
Creatinine, Ser: 0.88 mg/dL (ref 0.57–1.00)
GFR, EST AFRICAN AMERICAN: 89 mL/min/{1.73_m2} (ref >59)
GFR, EST NON AFRICAN AMERICAN: 77 mL/min/{1.73_m2} (ref >59)
GLOBULIN, TOTAL: 2.6 g/dL (ref 1.5–4.5)
Glucose: 72 mg/dL (ref 65–99)
POTASSIUM: 4.3 mmol/L (ref 3.5–5.2)
Sodium: 139 mmol/L (ref 134–144)
TOTAL PROTEIN: 6.5 g/dL (ref 6.0–8.5)

## 2018-04-17 LAB — LIPID PANEL
CHOL/HDL RATIO: 2.4 ratio (ref 0.0–4.4)
CHOLESTEROL TOTAL: 189 mg/dL (ref 100–199)
HDL: 78 mg/dL (ref >39)
LDL CALC: 92 mg/dL (ref 0–99)
Triglycerides: 94 mg/dL (ref 0–149)
VLDL Cholesterol Cal: 19 mg/dL (ref 5–40)

## 2018-04-17 NOTE — Telephone Encounter (Signed)
-----   Message from Anola Gurney, [place] sent at 04/17/2018  7:30 AM EDT ----- Labs look good including cholesterol and sugar.

## 2018-04-17 NOTE — Telephone Encounter (Signed)
Patient has been advised. KW 

## 2018-04-20 ENCOUNTER — Ambulatory Visit: Payer: Commercial Managed Care - PPO | Admitting: Obstetrics and Gynecology

## 2018-04-23 ENCOUNTER — Ambulatory Visit (INDEPENDENT_AMBULATORY_CARE_PROVIDER_SITE_OTHER): Payer: Commercial Managed Care - PPO | Admitting: Obstetrics and Gynecology

## 2018-04-23 ENCOUNTER — Encounter: Payer: Self-pay | Admitting: Obstetrics and Gynecology

## 2018-04-23 VITALS — BP 112/68 | HR 81 | Wt 156.0 lb

## 2018-04-23 DIAGNOSIS — N951 Menopausal and female climacteric states: Secondary | ICD-10-CM | POA: Diagnosis not present

## 2018-04-23 DIAGNOSIS — F329 Major depressive disorder, single episode, unspecified: Secondary | ICD-10-CM

## 2018-04-23 DIAGNOSIS — F419 Anxiety disorder, unspecified: Principal | ICD-10-CM

## 2018-04-23 DIAGNOSIS — F418 Other specified anxiety disorders: Secondary | ICD-10-CM | POA: Diagnosis not present

## 2018-04-23 MED ORDER — ESTROGENS CONJUGATED 0.625 MG PO TABS: ORAL_TABLET | ORAL | 3 refills | Status: DC

## 2018-04-23 MED ORDER — ESCITALOPRAM OXALATE 20 MG PO TABS: 20.0000 mg | ORAL_TABLET | Freq: Every day | ORAL | 3 refills | Status: DC

## 2018-04-23 NOTE — Progress Notes (Signed)
Obstetrics & Gynecology Office Visit   Chief Complaint:  Chief Complaint  Patient presents with  . Follow-up    Medication    History of Present Illness: The patient is a 47 y.o. female presenting follow up for symptoms of anxiety and depression.  The patient is currently taking lexapro 20mg  for the management of her symptoms.  She has not had any recent situational stressors.  She reports good resolution of symptoms.  She denies anhedonia, day time somnolence, insomnia, risk taking behavior, irritability, increased appetite, decreased appetite, social anxiety, agorophobia, feelings of guilt, feelings of worthlessness, suicidal ideation, homicidal ideation, auditory hallucinations and visual hallucinations. Symptoms have improved since last visit.     Review of Systems: Review of Systems  Constitutional: Negative for chills and fever.  HENT: Negative for congestion.   Respiratory: Negative for cough and shortness of breath.   Cardiovascular: Negative for chest pain and palpitations.  Gastrointestinal: Negative for abdominal pain, constipation, diarrhea, heartburn, nausea and vomiting.  Genitourinary: Negative for dysuria, frequency and urgency.  Skin: Negative for itching and rash.  Neurological: Negative for dizziness and headaches.  Endo/Heme/Allergies: Negative for polydipsia.  Psychiatric/Behavioral: Negative for depression.   Past Medical History:  Past Medical History:  Diagnosis Date  . Trochanteric bursitis of left hip     Past Surgical History:  Past Surgical History:  Procedure Laterality Date  . ABDOMINAL HYSTERECTOMY  2003    Gynecologic History: No LMP recorded. Patient has had a hysterectomy.  Obstetric History: 2004  Family History:  Family History  Problem Relation Age of Onset  . Hypertension Mother   . Hypothyroidism Mother   . Heart disease Mother   . Healthy Father   . Hypertension Brother   . Breast cancer Neg Hx     Social History:    Social History   Socioeconomic History  . Marital status: Married    Spouse name: Not on file  . Number of children: Not on file  . Years of education: Not on file  . Highest education level: Not on file  Occupational History  . Not on file  Social Needs  . Financial resource strain: Not on file  . Food insecurity:    Worry: Not on file    Inability: Not on file  . Transportation needs:    Medical: Not on file    Non-medical: Not on file  Tobacco Use  . Smoking status: Never Smoker  . Smokeless tobacco: Never Used  Substance and Sexual Activity  . Alcohol use: No    Alcohol/week: 0.0 standard drinks  . Drug use: No  . Sexual activity: Yes    Birth control/protection: Surgical    Comment: Hysterectomy  Lifestyle  . Physical activity:    Days per week: Not on file    Minutes per session: Not on file  . Stress: Not on file  Relationships  . Social connections:    Talks on phone: Not on file    Gets together: Not on file    Attends religious service: Not on file    Active member of club or organization: Not on file    Attends meetings of clubs or organizations: Not on file    Relationship status: Not on file  . Intimate partner violence:    Fear of current or ex partner: Not on file    Emotionally abused: Not on file    Physically abused: Not on file    Forced sexual activity: Not on  file  Other Topics Concern  . Not on file  Social History Narrative  . Not on file    Allergies:  No Known Allergies  Medications: Prior to Admission medications   Medication Sig Start Date End Date Taking? Authorizing Provider  escitalopram (LEXAPRO) 20 MG tablet TAKE 1 TABLET BY MOUTH EVERY DAY 04/16/18   Vena Austria, MD  fluocinonide cream (LIDEX) 0.05 % Apply 1 application topically 3 (three) times daily. To upper back rash as needed 04/16/18   Anola Gurney, PA  hydrOXYzine (ATARAX/VISTARIL) 25 MG tablet Take 1 tablet (25 mg total) by mouth every 6 (six) hours as  needed for anxiety. 02/20/18   Vena Austria, MD  PREMARIN 0.625 MG tablet TAKE 1 TABLET (0.625 MG TOTAL) BY MOUTH DAILY. 04/16/18   Vena Austria, MD    Physical Exam Vitals:  Vitals:   04/23/18 1116  BP: 112/68  Pulse: 81   No LMP recorded. Patient has had a hysterectomy.  General: NAD HEENT: normocephalic, anicteric Pulmonary: No increased work of breathing Neurologic: Grossly intact Psychiatric: mood appropriate, affect full    GAD 7 : Generalized Anxiety Score 04/23/2018 03/22/2018 02/20/2018  Nervous, Anxious, on Edge 1 3 2   Control/stop worrying 1 3 2   Worry too much - different things 1 3 1   Trouble relaxing 1 3 2   Restless 1 2 2   Easily annoyed or irritable 1 3 3   Afraid - awful might happen 1 3 3   Total GAD 7 Score 7 20 15   Anxiety Difficulty Somewhat difficult Somewhat difficult Somewhat difficult    Depression screen [facility] 2/9 04/23/2018 04/16/2018 03/22/2018  Decreased Interest 0 1 1  Down, Depressed, Hopeless 0 0 1  PHQ - 2 Score 0 1 2  Altered sleeping 1 1 1   Tired, decreased energy 1 1 1   Change in appetite 0 1 1  Feeling bad or failure about yourself  0 0 0  Trouble concentrating 1 1 2   Moving slowly or fidgety/restless 1 1 2   Suicidal thoughts 0 0 0  PHQ-9 Score 4 6 9   Difficult doing work/chores - Somewhat difficult Somewhat difficult    Depression screen [facility] 2/9 04/23/2018 04/16/2018 03/22/2018  Decreased Interest 0 1 1  Down, Depressed, Hopeless 0 0 1  PHQ - 2 Score 0 1 2  Altered sleeping 1 1 1   Tired, decreased energy 1 1 1   Change in appetite 0 1 1  Feeling bad or failure about yourself  0 0 0  Trouble concentrating 1 1 2   Moving slowly or fidgety/restless 1 1 2   Suicidal thoughts 0 0 0  PHQ-9 Score 4 6 9   Difficult doing work/chores - Somewhat difficult Somewhat difficult     Assessment: 47 y.o. G3P3003 follow up anxiety and depression  Plan: Problem List Items Addressed This Visit    None    Visit Diagnoses    Anxiety and  depression    -  Primary   Relevant Medications   escitalopram (LEXAPRO) 20 MG tablet   Vasomotor symptoms due to menopause          1)  Good improvement in symptoms on Lexapro.  Continue Lexapro at current dose.  2) Vasomotor symptoms - good imrpovement in vasomotor frequency and intensity.  Continue Premarin 0.625mg  will plan on dose reduction in 1 year   3) A total of 15 minutes were spent in face-to-face contact with the patient during this encounter with over half of that time devoted to counseling and coordination of  care.  4) Return in about 3 months (around 07/24/2018) for medication follow up.    Vena Austria, MD, [facility], [facility]

## 2018-05-08 ENCOUNTER — Ambulatory Visit (INDEPENDENT_AMBULATORY_CARE_PROVIDER_SITE_OTHER): Payer: Commercial Managed Care - PPO | Admitting: Podiatry

## 2018-05-08 ENCOUNTER — Encounter: Payer: Self-pay | Admitting: Podiatry

## 2018-05-08 ENCOUNTER — Ambulatory Visit (INDEPENDENT_AMBULATORY_CARE_PROVIDER_SITE_OTHER): Payer: Commercial Managed Care - PPO

## 2018-05-08 VITALS — BP 108/72 | HR 76

## 2018-05-08 DIAGNOSIS — M21611 Bunion of right foot: Secondary | ICD-10-CM

## 2018-05-08 NOTE — Patient Instructions (Signed)
Pre-Operative Instructions  Congratulations, you have decided to take an important step towards improving your quality of life.  You can be assured that the doctors and staff at [facility] will be with you every step of the way.  Here are some important things you should know:  1. Plan to be at the surgery center/hospital at least 1 (one) hour prior to your scheduled time, unless otherwise directed by the surgical center/hospital staff.  You must have a responsible adult accompany you, remain during the surgery and drive you home.  Make sure you have directions to the surgical center/hospital to ensure you arrive on time. 2. If you are having surgery at [facility] or [facility], you will need a copy of your medical history and physical form from your family physician within one month prior to the date of surgery. We will give you a form for your primary physician to complete.  3. We make every effort to accommodate the date you request for surgery.  However, there are times where surgery dates or times have to be moved.  We will contact you as soon as possible if a change in schedule is required.   4. No aspirin/ibuprofen for one week before surgery.  If you are on aspirin, any non-steroidal anti-inflammatory medications (Mobic, Aleve, Ibuprofen) should not be taken seven (7) days prior to your surgery.  You make take Tylenol for pain prior to surgery.  5. Medications - If you are taking daily heart and blood pressure medications, seizure, reflux, allergy, asthma, anxiety, pain or diabetes medications, make sure you notify the surgery center/hospital before the day of surgery so they can tell you which medications you should take or avoid the day of surgery. 6. No food or drink after midnight the night before surgery unless directed otherwise by surgical center/hospital staff. 7. No alcoholic beverages 24-hours prior to surgery.  No smoking 24-hours prior or 24-hours after  surgery. 8. Wear loose pants or shorts. They should be loose enough to fit over bandages, boots, and casts. 9. Don't wear slip-on shoes. Sneakers are preferred. 10. Bring your boot with you to the surgery center/hospital.  Also bring crutches or a walker if your physician has prescribed it for you.  If you do not have this equipment, it will be provided for you after surgery. 11. If you have not been contacted by the surgery center/hospital by the day before your surgery, call to confirm the date and time of your surgery. 12. Leave-time from work may vary depending on the type of surgery you have.  Appropriate arrangements should be made prior to surgery with your employer. 13. Prescriptions will be provided immediately following surgery by your doctor.  Fill these as soon as possible after surgery and take the medication as directed. Pain medications will not be refilled on weekends and must be approved by the doctor. 14. Remove nail polish on the operative foot and avoid getting pedicures prior to surgery. 15. Wash the night before surgery.  The night before surgery wash the foot and leg well with water and the antibacterial soap provided. Be sure to pay special attention to beneath the toenails and in between the toes.  Wash for at least three (3) minutes. Rinse thoroughly with water and dry well with a towel.  Perform this wash unless told not to do so by your physician.  Enclosed: 1 Ice pack (please put in freezer the night before surgery)   1 Hibiclens skin cleaner     Pre-op instructions  If you have any questions regarding the instructions, please do not hesitate to call our office.  [place]: [address], [place], [place] [address] -- [phone]  [place]: [address] [place] [address] [place], [place] [address] -- [phone]  [place]: [address]  [place], [place] [address] -- [phone]  [place]: [address], [address], [place], [place] [address] -- [phone]  Website:  [email or web address] 

## 2018-05-09 ENCOUNTER — Telehealth: Payer: Self-pay | Admitting: *Deleted

## 2018-05-09 NOTE — Telephone Encounter (Signed)
"  If you could give me a call back on my cell.  I had seen Dr. Logan Bores yesterday and he said we needed to schedule bunion surgery.  If you would, give me a call back.  Thank you."

## 2018-05-09 NOTE — Progress Notes (Signed)
   Subjective: 47 year old female presenting today as a new patient with a chief complaint of a pain to the dorsomedial aspect and hallux of the right foot foot that began over a year ago. She states the pain has worsened in the past month. She was seen by her PCP and diagnosed with a bunion. Wearing certain shoes increases the pain. She has not done anything for treatment. Patient is here for further evaluation and treatment.   Past Medical History:  Diagnosis Date  . Trochanteric bursitis of left hip       Objective: Physical Exam General: The patient is alert and oriented x3 in no acute distress.  Dermatology: Skin is cool, dry and supple bilateral lower extremities. Negative for open lesions or macerations.  Vascular: Palpable pedal pulses bilaterally. No edema or erythema noted. Capillary refill within normal limits.  Neurological: Epicritic and protective threshold grossly intact bilaterally.   Musculoskeletal Exam: Clinical evidence of bunion deformity noted to the respective foot. There is a moderate pain on palpation range of motion of the first MPJ. Lateral deviation of the hallux noted consistent with hallux abductovalgus.  Radiographic Exam: Increased intermetatarsal angle greater than 15 with a hallux abductus angle greater than 30 noted on AP view. Moderate degenerative changes noted within the first MPJ.  Assessment: 1. HAV w/ bunion deformity right    Plan of Care:  1. Patient was evaluated. X-Rays reviewed. 2. Today we discussed the conservative versus surgical management of the presenting pathology. The patient opts for surgical management. All possible complications and details of the procedure were explained. All patient questions were answered. No guarantees were expressed or implied. 3. Authorization for surgery was initiated today. Surgery will consist of bunionectomy with 1st metatarsal osteotomy right.  4. CAM boot dispensed.  5. Return to clinic one week  post op.      Felecia Shelling, DPM [facility]  Dr. Felecia Shelling, DPM    [address]                                        [place], [place] [address]                Office [phone]  Fax [phone]

## 2018-05-11 NOTE — Telephone Encounter (Signed)
"  I'm trying to schedule my surgery with Dr. Amalia Hailey.  I want to have it done by the end of the year because I have met my deductible."  Dr. Amalia Hailey can do it on December 19.  "Will it be okay for me to travel afterwards?"  As long as you are not driving and can keep your foot elevated, you will be fine.  "Okay great, December 19 is fine."  Someone from the surgical center will give you a call a day or two prior to your surgical date.  They will give you your arrival time.  You need to go online and register with the surgical center through their [employer] portal.

## 2018-06-28 ENCOUNTER — Other Ambulatory Visit: Payer: Self-pay | Admitting: Podiatry

## 2018-06-28 DIAGNOSIS — M2011 Hallux valgus (acquired), right foot: Secondary | ICD-10-CM | POA: Diagnosis not present

## 2018-06-28 HISTORY — PX: OTHER SURGICAL HISTORY: SHX169

## 2018-06-28 MED ORDER — OXYCODONE-ACETAMINOPHEN 5-325 MG PO TABS: 1.0000 | ORAL_TABLET | Freq: Four times a day (QID) | ORAL | 0 refills | Status: DC | PRN

## 2018-06-28 MED ORDER — IBUPROFEN 800 MG PO TABS: 800.0000 mg | ORAL_TABLET | Freq: Three times a day (TID) | ORAL | 0 refills | Status: DC | PRN

## 2018-06-28 NOTE — Progress Notes (Signed)
.  postop

## 2018-07-05 ENCOUNTER — Ambulatory Visit (INDEPENDENT_AMBULATORY_CARE_PROVIDER_SITE_OTHER): Payer: Self-pay | Admitting: Podiatry

## 2018-07-05 ENCOUNTER — Ambulatory Visit (INDEPENDENT_AMBULATORY_CARE_PROVIDER_SITE_OTHER): Payer: Commercial Managed Care - PPO

## 2018-07-05 VITALS — BP 117/70 | HR 72 | Temp 97.5°F

## 2018-07-05 DIAGNOSIS — M21611 Bunion of right foot: Secondary | ICD-10-CM

## 2018-07-05 DIAGNOSIS — Z9889 Other specified postprocedural states: Secondary | ICD-10-CM

## 2018-07-05 NOTE — Progress Notes (Signed)
This patient presents to the office for an evaluation of her right foot following surgery by Dr. Logan Bores.  Patient had an Eliberto Ivory bunionectomy performed with screw fixation.  Patient states that she is doing all right and the surgery is not sore today.  Patient presents the office today wearing her bandage that was applied at the surgical center and walking in a cam walker.  She presents the office today for her first postoperative visit following her surgery.  Objective neurovascular status intact.  No evidence of any pain or discomfort in the calf on her right leg.  Sutures are intact and the wound is well coapted.  There is minimal swelling with no increased temperature at the surgical site.  Patient has good range of motion of the first MPJ with no limitation of motion due to the K wire.  S/P Foot surgery right foot.  POV.  X-rays reveal good alignment at the osteotomy site and the screws are intact.  No other bony pathology is noted.  Discussed this condition with this patient.  Told her she is doing well but we will will reapply a bandage at the surgical site and she still needs to keep this bandage dry and clean.  She is to ambulate with that cam walker until she is seen by Dr. Logan Bores at her next visit.  Return to the clinic in 1 week for evaluation and treatment.  Helane Gunther DPM

## 2018-07-16 ENCOUNTER — Ambulatory Visit (INDEPENDENT_AMBULATORY_CARE_PROVIDER_SITE_OTHER): Payer: Commercial Managed Care - PPO | Admitting: Podiatry

## 2018-07-16 DIAGNOSIS — M21611 Bunion of right foot: Secondary | ICD-10-CM

## 2018-07-16 DIAGNOSIS — Z9889 Other specified postprocedural states: Secondary | ICD-10-CM

## 2018-07-18 NOTE — Progress Notes (Signed)
   Subjective:  Patient presents today status post bunionectomy right. DOS: 06/28/18. She states she is doing well regarding the surgery. She denies any significant pain or modifying factors. She has been using the CAM boot as directed. Patient is here for further evaluation and treatment.    Past Medical History:  Diagnosis Date  . Trochanteric bursitis of left hip       Objective/Physical Exam Neurovascular status intact.  Skin incisions appear to be well coapted with sutures and staples intact. No sign of infectious process noted. No dehiscence. No active bleeding noted. Moderate edema noted to the surgical extremity.  Assessment: 1. s/p bunionectomy right. DOS: 06/28/18   Plan of Care:  1. Patient was evaluated.  2. Sutures removed.  3. Post op shoe dispensed. Discontinue using CAM boot.  4. Compression anklet dispensed.  5. Return to clinic in 2 weeks.    Felecia Shelling, DPM [facility]  Dr. Felecia Shelling, DPM    [address]                                        [place], [place] [address]                Office [phone]  Fax [phone]

## 2018-07-26 ENCOUNTER — Encounter: Payer: Self-pay | Admitting: Obstetrics and Gynecology

## 2018-07-26 ENCOUNTER — Ambulatory Visit (INDEPENDENT_AMBULATORY_CARE_PROVIDER_SITE_OTHER): Payer: Commercial Managed Care - PPO | Admitting: Obstetrics and Gynecology

## 2018-07-26 VITALS — BP 128/82 | HR 79 | Wt 168.0 lb

## 2018-07-26 DIAGNOSIS — F329 Major depressive disorder, single episode, unspecified: Secondary | ICD-10-CM | POA: Diagnosis not present

## 2018-07-26 DIAGNOSIS — F419 Anxiety disorder, unspecified: Secondary | ICD-10-CM | POA: Diagnosis not present

## 2018-07-26 NOTE — Progress Notes (Signed)
Obstetrics & Gynecology Office Visit   Chief Complaint:  Chief Complaint  Patient presents with  . Follow-up    Medication     History of Present Illness: The patient is a 48 y.o. female presenting follow up for symptoms of anxiety and depression.  The patient is currently taking Lexapro 20mg  for the management of her symptoms.  She has not had any recent situational stressors.  She reports symptoms continue to be well controlled.  She denies anhedonia, day time somnolence, insomnia, risk taking behavior, irritability, social anxiety, agorophobia, feelings of guilt, feelings of worthlessness, suicidal ideation, homicidal ideation, auditory hallucinations and visual hallucinations. Symptoms have remained unchanged since last visit.    She has noted some increased vasomotor symptoms over the past few weeks.  Review of Systems: Review of Systems  Constitutional: Negative.   Gastrointestinal: Negative.   Neurological: Negative for headaches.     Past Medical History:  Past Medical History:  Diagnosis Date  . Trochanteric bursitis of left hip     Past Surgical History:  Past Surgical History:  Procedure Laterality Date  . ABDOMINAL HYSTERECTOMY  2003  . bunion removal  06/28/2018   right foot    Gynecologic History: No LMP recorded. Patient has had a hysterectomy.  Obstetric History: [id number]  Family History:  Family History  Problem Relation Age of Onset  . Hypertension Mother   . Hypothyroidism Mother   . Heart disease Mother   . Healthy Father   . Hypertension Brother   . Breast cancer Neg Hx     Social History:  Social History   Socioeconomic History  . Marital status: Married    Spouse name: Not on file  . Number of children: Not on file  . Years of education: Not on file  . Highest education level: Not on file  Occupational History  . Not on file  Social Needs  . Financial resource strain: Not on file  . Food insecurity:    Worry: Not on file   Inability: Not on file  . Transportation needs:    Medical: Not on file    Non-medical: Not on file  Tobacco Use  . Smoking status: Never Smoker  . Smokeless tobacco: Never Used  Substance and Sexual Activity  . Alcohol use: No    Alcohol/week: 0.0 standard drinks  . Drug use: No  . Sexual activity: Yes    Birth control/protection: Surgical    Comment: Hysterectomy  Lifestyle  . Physical activity:    Days per week: Not on file    Minutes per session: Not on file  . Stress: Not on file  Relationships  . Social connections:    Talks on phone: Not on file    Gets together: Not on file    Attends religious service: Not on file    Active member of club or organization: Not on file    Attends meetings of clubs or organizations: Not on file    Relationship status: Not on file  . Intimate partner violence:    Fear of current or ex partner: Not on file    Emotionally abused: Not on file    Physically abused: Not on file    Forced sexual activity: Not on file  Other Topics Concern  . Not on file  Social History Narrative  . Not on file    Allergies:  No Known Allergies  Medications: Prior to Admission medications   Medication Sig Start Date End Date  Taking? Authorizing Provider  escitalopram (LEXAPRO) 20 MG tablet Take 1 tablet (20 mg total) by mouth daily. 04/23/18  Yes Vena Austria, MD  estrogens, conjugated, (PREMARIN) 0.625 MG tablet TAKE 1 TABLET (0.625 MG TOTAL) BY MOUTH DAILY. 04/23/18  Yes Vena Austria, MD  fluocinonide cream (LIDEX) 0.05 % Apply 1 application topically 3 (three) times daily. To upper back rash as needed 04/16/18  Yes Anola Gurney, PA  hydrOXYzine (ATARAX/VISTARIL) 25 MG tablet Take 1 tablet (25 mg total) by mouth every 6 (six) hours as needed for anxiety. 02/20/18  Yes Vena Austria, MD  ibuprofen (ADVIL,MOTRIN) 800 MG tablet Take 1 tablet (800 mg total) by mouth every 8 (eight) hours as needed. 06/28/18  Yes Felecia Shelling, DPM     Physical Exam Vitals:  Vitals:   07/26/18 1024  BP: 128/82  Pulse: 79   No LMP recorded. Patient has had a hysterectomy.  General: NAD HEENT: normocephalic, anicteric Pulmonary: No increased work of breathing Neurologic: Grossly intact Psychiatric: mood appropriate, affect full    GAD 7 : Generalized Anxiety Score 07/26/2018 04/23/2018 03/22/2018 02/20/2018  Nervous, Anxious, on Edge 1 1 3 2   Control/stop worrying 0 1 3 2   Worry too much - different things 0 1 3 1   Trouble relaxing 0 1 3 2   Restless 0 1 2 2   Easily annoyed or irritable 1 1 3 3   Afraid - awful might happen 1 1 3 3   Total GAD 7 Score 3 7 20 15   Anxiety Difficulty Not difficult at all Somewhat difficult Somewhat difficult Somewhat difficult    Depression screen [facility] 2/9 07/26/2018 04/23/2018 04/16/2018  Decreased Interest 0 0 1  Down, Depressed, Hopeless 0 0 0  PHQ - 2 Score 0 0 1  Altered sleeping 1 1 1   Tired, decreased energy 1 1 1   Change in appetite 1 0 1  Feeling bad or failure about yourself  0 0 0  Trouble concentrating 1 1 1   Moving slowly or fidgety/restless 1 1 1   Suicidal thoughts 0 0 0  PHQ-9 Score 5 4 6   Difficult doing work/chores - - Somewhat difficult    Depression screen [facility] 2/9 07/26/2018 04/23/2018 04/16/2018 03/22/2018  Decreased Interest 0 0 1 1  Down, Depressed, Hopeless 0 0 0 1  PHQ - 2 Score 0 0 1 2  Altered sleeping 1 1 1 1   Tired, decreased energy 1 1 1 1   Change in appetite 1 0 1 1  Feeling bad or failure about yourself  0 0 0 0  Trouble concentrating 1 1 1 2   Moving slowly or fidgety/restless 1 1 1 2   Suicidal thoughts 0 0 0 0  PHQ-9 Score 5 4 6 9   Difficult doing work/chores - - Somewhat difficult Somewhat difficult     Assessment: 48 y.o. G3P3003 follow up anxiety/depression  Plan: Problem List Items Addressed This Visit    None    Visit Diagnoses    Anxiety and depression    -  Primary      1) Anxiety/Depression - Continue lexapro - GAD-7 3 - PHQ-9  5  2) Thyroid and B12 screen has been obtained previously  3) Return in about 9 months (around 04/26/2019) for annual.    Vena Austria, MD, [facility], [facility]

## 2018-07-30 ENCOUNTER — Ambulatory Visit (INDEPENDENT_AMBULATORY_CARE_PROVIDER_SITE_OTHER): Payer: Commercial Managed Care - PPO | Admitting: Podiatry

## 2018-07-30 ENCOUNTER — Ambulatory Visit (INDEPENDENT_AMBULATORY_CARE_PROVIDER_SITE_OTHER): Payer: Commercial Managed Care - PPO

## 2018-07-30 DIAGNOSIS — Z9889 Other specified postprocedural states: Secondary | ICD-10-CM

## 2018-07-30 DIAGNOSIS — M21611 Bunion of right foot: Secondary | ICD-10-CM

## 2018-08-06 NOTE — Progress Notes (Signed)
   Subjective:  Patient presents today status post bunionectomy right. DOS: 06/28/18. She states she is doing well. She notes a nodule to the dorsum of the foot. She denies any modifying factors. She has been using the post op shoe as directed. Patient is here for further evaluation and treatment.    Past Medical History:  Diagnosis Date  . Trochanteric bursitis of left hip       Objective/Physical Exam Neurovascular status intact.  Skin incisions appear to be well coapted. No sign of infectious process noted. No dehiscence. No active bleeding noted. Moderate edema noted to the surgical extremity.  Radiographic Exam:  Orthopedic hardware and osteotomies sites appear to be stable with routine healing.  Assessment: 1. s/p bunionectomy right. DOS: 06/28/18   Plan of Care:  1. Patient was evaluated. X-Rays reviewed.  2. Transition out of post op shoe into good shoe gear.  3. Continue using compression anklet.  4. Return to clinic in 6 weeks.     Felecia Shelling, DPM [facility]  Dr. Felecia Shelling, DPM    [address]                                        [place], [place] [address]                Office [phone]  Fax [phone]

## 2018-09-10 ENCOUNTER — Encounter: Payer: Self-pay | Admitting: Podiatry

## 2018-09-10 ENCOUNTER — Ambulatory Visit (INDEPENDENT_AMBULATORY_CARE_PROVIDER_SITE_OTHER): Payer: Commercial Managed Care - PPO | Admitting: Podiatry

## 2018-09-10 DIAGNOSIS — M21611 Bunion of right foot: Secondary | ICD-10-CM

## 2018-09-10 DIAGNOSIS — Z9889 Other specified postprocedural states: Secondary | ICD-10-CM

## 2018-09-12 NOTE — Progress Notes (Signed)
   Subjective:  Patient presents today status post bunionectomy right. DOS: 06/28/18. She states she is doing well. She denies any significant pain or modifying factors. She has been using the compression anklet as directed. Patient is here for further evaluation and treatment.    Past Medical History:  Diagnosis Date  . Trochanteric bursitis of left hip       Objective/Physical Exam Neurovascular status intact.  Skin incisions appear to be well coapted. No sign of infectious process noted. No dehiscence. No active bleeding noted. Moderate edema noted to the surgical extremity.  Assessment: 1. s/p bunionectomy right. DOS: 06/28/18   Plan of Care:  1. Patient was evaluated. 2. May resume full activity with no restrictions.  3. Recommended good shoe gear.  4. Return to clinic as needed.    Felecia Shelling, DPM [facility]  Dr. Felecia Shelling, DPM    [address]                                        [place], [place] [address]                Office [phone]  Fax [phone]

## 2018-09-19 ENCOUNTER — Encounter: Payer: Self-pay | Admitting: Podiatry

## 2019-01-17 ENCOUNTER — Other Ambulatory Visit: Payer: Self-pay | Admitting: Obstetrics and Gynecology

## 2019-01-17 DIAGNOSIS — Z1231 Encounter for screening mammogram for malignant neoplasm of breast: Secondary | ICD-10-CM

## 2019-02-25 ENCOUNTER — Ambulatory Visit
Admission: RE | Admit: 2019-02-25 | Discharge: 2019-02-25 | Disposition: A | Discharge status: Home / Self Care | Payer: Commercial Managed Care - PPO | Source: Ambulatory Visit | Attending: Obstetrics and Gynecology | Admitting: Obstetrics and Gynecology

## 2019-02-25 DIAGNOSIS — Z1231 Encounter for screening mammogram for malignant neoplasm of breast: Secondary | ICD-10-CM | POA: Diagnosis present

## 2019-03-12 ENCOUNTER — Ambulatory Visit (INDEPENDENT_AMBULATORY_CARE_PROVIDER_SITE_OTHER): Payer: Commercial Managed Care - PPO | Admitting: Physician Assistant

## 2019-03-12 ENCOUNTER — Other Ambulatory Visit: Payer: Self-pay

## 2019-03-12 ENCOUNTER — Encounter: Payer: Self-pay | Admitting: Physician Assistant

## 2019-03-12 VITALS — BP 107/75 | HR 61 | Temp 97.3°F | Resp 16 | Wt 169.0 lb

## 2019-03-12 DIAGNOSIS — L259 Unspecified contact dermatitis, unspecified cause: Secondary | ICD-10-CM

## 2019-03-12 MED ORDER — PREDNISONE 10 MG PO TABS: ORAL_TABLET | ORAL | 0 refills | Status: DC

## 2019-03-12 NOTE — Progress Notes (Signed)
       [name] Female    DOB: January 17, 1971   48 y.o.   MRN: [id number] Visit Date: 03/12/2019  Today's Provider: Trinna Post, PA-C   Chief Complaint  Patient presents with  . Rash   Subjective:     HPI Rash: Patient complains of rash involving the abdomen, back, face, head and neck. Rash started 3 days ago. Appearance of rash at onset: Color of lesion(s): pink. Rash has changed over time Initial distribution: abdomen, back, face and generalized.  Discomfort associated with rash: is pruritic.  Associated symptoms: none. Denies: cough and fever. Patient has not had previous evaluation of rash. Patient has not had previous treatment. Patient has had contacts with similar rash. Patient has identified precipitant. Patient has not had new exposures (soaps, lotions, laundry detergents, foods, medications, plants, insects or animals.)  Was using weed eater on Sunday and rash appeared after.   No Known Allergies   Current Outpatient Medications:  .  escitalopram (LEXAPRO) 20 MG tablet, Take 1 tablet (20 mg total) by mouth daily., Disp: 90 tablet, Rfl: 3 .  estrogens, conjugated, (PREMARIN) 0.625 MG tablet, TAKE 1 TABLET (0.625 MG TOTAL) BY MOUTH DAILY., Disp: 90 tablet, Rfl: 3  Review of Systems  Skin: Positive for color change and rash.    Social History   Tobacco Use  . Smoking status: Never Smoker  . Smokeless tobacco: Never Used  Substance Use Topics  . Alcohol use: No    Alcohol/week: 0.0 standard drinks      Objective:   BP 107/75 (BP Location: Left Arm, Patient Position: Sitting, Cuff Size: Large)   Pulse 61   Temp (!) 97.3 F (36.3 C) (Temporal)   Resp 16   Wt 169 lb (76.7 kg)   BMI 29.01 kg/m  Vitals:   03/12/19 1127  BP: 107/75  Pulse: 61  Resp: 16  Temp: (!) 97.3 F (36.3 C)  TempSrc: Temporal  Weight: 169 lb (76.7 kg)  Body mass index is 29.01 kg/m.   Physical Exam Skin:    General: Skin is warm and dry.     Findings: Rash  present.     Comments: Erythematous papules diffusely across body.   Neurological:     Mental Status: She is alert. Mental status is at baseline.  Psychiatric:        Mood and Affect: Mood normal.        Behavior: Behavior normal.      No results found for any visits on 03/12/19.     Assessment & Plan    1. Contact dermatitis, unspecified contact dermatitis type, unspecified trigger  - predniSONE (DELTASONE) 10 MG tablet; Take 6 pills on day 1, take 5 pills on day 2, take 4 pills on day 3 and so on until complete.  Dispense: 21 tablet; Refill: 0  The entirety of the information documented in the History of Present Illness, Review of Systems and Physical Exam were personally obtained by me. Portions of this information were initially documented by Lynford Humphrey, CMA and reviewed by me for thoroughness and accuracy.   F/u PRN     Trinna Post, PA-C  [facility]

## 2019-03-12 NOTE — Patient Instructions (Signed)
Poison Ivy Dermatitis Poison ivy dermatitis is redness and soreness of the skin caused by chemicals in the leaves of the poison ivy plant. You may have very bad itching, swelling, a rash, and blisters. What are the causes?  Touching a poison ivy plant.  Touching something that has the chemical on it. This may include animals or objects that have come in contact with the plant. What increases the risk?  Going outdoors often in wooded or marshy areas.  Going outdoors without wearing protective clothing, such as closed shoes, long pants, and a long-sleeved shirt. What are the signs or symptoms?   Skin redness.  Very bad itching.  A rash that often includes bumps and blisters. ? The rash usually appears 48 hours after exposure, if you have been exposed before. ? If this is the first time you have been exposed, the rash may not appear until a week after exposure.  Swelling. This may occur if the reaction is very bad. Symptoms usually last for 1-2 weeks. The first time you develop this condition, symptoms may last 3-4 weeks. How is this treated? This condition may be treated with:  Hydrocortisone cream or calamine lotion to relieve itching.  Oatmeal baths to soothe the skin.  Medicines, such as over-the-counter antihistamine tablets.  Oral steroid medicine for more severe reactions. Follow these instructions at home: Medicines  Take or apply over-the-counter and prescription medicines only as told by your doctor.  Use hydrocortisone cream or calamine lotion as needed to help with itching. General instructions  Do not scratch or rub your skin.  Put a cold, wet cloth (cold compress) on the affected areas or take baths in cool water. This will help with itching.  Avoid hot baths and showers.  Take oatmeal baths as needed. Use colloidal oatmeal. You can get this at a pharmacy or grocery store. Follow the instructions on the package.  While you have the rash, wash your clothes  right after you wear them.  Keep all follow-up visits as told by your health care provider. This is important. How is this prevented?   Know what poison ivy looks like, so you can avoid it. ? This plant has three leaves with flowering branches on a single stem. ? The leaves are glossy. ? The leaves have uneven edges that come to a point at the front.  If you touch poison ivy, wash your skin with soap and water right away. Be sure to wash under your fingernails.  When hiking or camping, wear long pants, a long-sleeved shirt, tall socks, and hiking boots. You can also use a lotion on your skin that helps to prevent contact with poison ivy.  If you think that your clothes or outdoor gear came in contact with poison ivy, rinse them off with a garden hose before you bring them inside your house.  When doing yard work or gardening, wear gloves, long sleeves, long pants, and boots. Wash your garden tools and gloves if they come in contact with poison ivy.  If you think that your pet has come into contact with poison ivy, wash him or her with pet shampoo and water. Make sure to wear gloves while washing your pet. Contact a doctor if:  You have open sores in the rash area.  You have more redness, swelling, or pain in the rash area.  You have redness that spreads beyond the rash area.  You have fluid, blood, or pus coming from the rash area.  You have a   fever.  You have a rash over a large area of your body.  You have a rash on your eyes, mouth, or genitals.  Your rash does not get better after a few weeks. Get help right away if:  Your face swells or your eyes swell shut.  You have trouble breathing.  You have trouble swallowing. These symptoms may be an emergency. Do not wait to see if the symptoms will go away. Get medical help right away. Call your local emergency services (911 in the [place] Do not drive yourself to the hospital. Summary  Poison ivy dermatitis is redness and  soreness of the skin caused by chemicals in the leaves of the poison ivy plant.  You may have skin redness, very bad itching, swelling, and a rash.  Do not scratch or rub your skin.  Take or apply over-the-counter and prescription medicines only as told by your doctor. This information is not intended to replace advice given to you by your health care provider. Make sure you discuss any questions you have with your health care provider. Document Released: 07/30/2010 Document Revised: 10/19/2018 Document Reviewed: 06/22/2018 Elsevier Patient Education  2020 Elsevier Inc.  

## 2019-03-20 ENCOUNTER — Other Ambulatory Visit: Payer: Self-pay | Admitting: Physician Assistant

## 2019-03-20 DIAGNOSIS — L259 Unspecified contact dermatitis, unspecified cause: Secondary | ICD-10-CM

## 2019-03-20 MED ORDER — PREDNISONE 10 MG PO TABS: ORAL_TABLET | ORAL | 0 refills | Status: DC

## 2019-04-18 ENCOUNTER — Ambulatory Visit (INDEPENDENT_AMBULATORY_CARE_PROVIDER_SITE_OTHER): Payer: Commercial Managed Care - PPO | Admitting: Physician Assistant

## 2019-04-18 ENCOUNTER — Other Ambulatory Visit: Payer: Self-pay

## 2019-04-18 ENCOUNTER — Encounter: Payer: Self-pay | Admitting: Physician Assistant

## 2019-04-18 VITALS — BP 107/74 | HR 75 | Temp 97.1°F | Resp 16 | Ht 64.0 in | Wt 168.0 lb

## 2019-04-18 DIAGNOSIS — Z0000 Encounter for general adult medical examination without abnormal findings: Secondary | ICD-10-CM | POA: Diagnosis not present

## 2019-04-18 DIAGNOSIS — Z23 Encounter for immunization: Secondary | ICD-10-CM

## 2019-04-18 DIAGNOSIS — Z1211 Encounter for screening for malignant neoplasm of colon: Secondary | ICD-10-CM

## 2019-04-18 DIAGNOSIS — R21 Rash and other nonspecific skin eruption: Secondary | ICD-10-CM

## 2019-04-18 DIAGNOSIS — J029 Acute pharyngitis, unspecified: Secondary | ICD-10-CM

## 2019-04-18 NOTE — Progress Notes (Signed)
[name], Female    DOB: April 30, 1971, 48 y.o.   MRN: [id number] Visit Date: 04/18/2019  Today's Provider: Trey Sailors, PA-C   Chief Complaint  Patient presents with  . Annual Exam   Subjective:     Annual physical exam Janice Strickland is a 48 y.o. female who presents today for health maintenance and complete physical. She feels well. She reports exercising some. She reports she is sleeping well.  Living in [place] with husband 9 years. 5 total children - grown children. Youngest daughter nursing school at [employer] on full scholarship. Daughter will graduate after this year. 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 Oldest son getting married this weekend.   Depression and Anxiety: Lexapro 10 mg daily with Dr. Bonney Aid.  PAP: 09/2016 Normal  Premarin Tablet every day via Dr. Bonney Aid   Granddaughter with type I diabetes Brother aged 66 heart attack (widow maker and has been stented)  Rash has bee ongoing for two years, refractory to treatment, would like to see dermatology.   Has history of tonsil stones and would like to explore tonsillectomy.   Wt Readings from Last 3 Encounters:  04/18/19 168 lb (76.2 kg)  03/12/19 169 lb (76.7 kg)  07/26/18 168 lb (76.2 kg)    -----------------------------------------------------------   Review of Systems  Constitutional: Positive for diaphoresis and fatigue.  Musculoskeletal: Positive for arthralgias.  All other systems reviewed and are negative.   Social History      She  reports that she has never smoked. She has never used smokeless tobacco. She reports that she does not drink alcohol or use drugs.       Social History   Socioeconomic History  . Marital status: Married    Spouse name: Not on file  . Number of children: Not on file  . Years of education: Not on file  . Highest education level: Not on file  Occupational History  . Not on file  Social  Needs  . Financial resource strain: Not on file  . Food insecurity    Worry: Not on file    Inability: Not on file  . Transportation needs    Medical: Not on file    Non-medical: Not on file  Tobacco Use  . Smoking status: Never Smoker  . Smokeless tobacco: Never Used  Substance and Sexual Activity  . Alcohol use: No    Alcohol/week: 0.0 standard drinks  . Drug use: No  . Sexual activity: Yes    Birth control/protection: Surgical    Comment: Hysterectomy  Lifestyle  . Physical activity    Days per week: Not on file    Minutes per session: Not on file  . Stress: Not on file  Relationships  . Social Musician on phone: Not on file    Gets together: Not on file    Attends religious service: Not on file    Active member of club or organization: Not on file    Attends meetings of clubs or organizations: Not on file    Relationship status: Not on file  Other Topics Concern  . Not on file  Social History Narrative  . Not on file    Past Medical History:  Diagnosis Date  . Trochanteric bursitis of left hip      Patient Active Problem List   Diagnosis Date Noted  . Climacteric 07/01/2015  Past Surgical History:  Procedure Laterality Date  . ABDOMINAL HYSTERECTOMY  2003  . bunion removal  06/28/2018   right foot    Family History        Family Status  Relation Name Status  . Mother  Alive  . Father  Alive  . Sister  Alive  . Brother  Alive  . MGM  Deceased       heart failure  . PGF  Deceased  . Neg Hx  (Not Specified)        Her family history includes Healthy in her father; Heart disease in her mother; Hypertension in her brother and mother; Hypothyroidism in her mother. There is no history of Breast cancer.      No Known Allergies   Current Outpatient Medications:  .  escitalopram (LEXAPRO) 20 MG tablet, Take 1 tablet (20 mg total) by mouth daily. (Patient taking differently: Take 20 mg by mouth daily. Takes 0.5 tablet daily), Disp: 90  tablet, Rfl: 3 .  estrogens, conjugated, (PREMARIN) 0.625 MG tablet, TAKE 1 TABLET (0.625 MG TOTAL) BY MOUTH DAILY., Disp: 90 tablet, Rfl: 3 .  predniSONE (DELTASONE) 10 MG tablet, Take 6 pills on day 1, take 5 pills on day 2, take 4 pills on day 3 and so on until complete. (Patient not taking: Reported on 04/18/2019), Disp: 21 tablet, Rfl: 0   Patient Care Team: Birdie Sons, MD as PCP - General (Family Medicine)    Objective:    Vitals: BP 107/74 (BP Location: Left Arm, Patient Position: Sitting, Cuff Size: Large)   Pulse 75   Temp (!) 97.1 F (36.2 C) (Other (Comment))   Resp 16   Ht 5\' 4"  (1.626 m)   Wt 168 lb (76.2 kg)   SpO2 97%   BMI 28.84 kg/m    Vitals:   04/18/19 1004  BP: 107/74  Pulse: 75  Resp: 16  Temp: (!) 97.1 F (36.2 C)  TempSrc: Other (Comment)  SpO2: 97%  Weight: 168 lb (76.2 kg)  Height: 5\' 4"  (1.626 m)     Physical Exam Constitutional:      Appearance: Normal appearance.  HENT:     Right Ear: Tympanic membrane and ear canal normal.     Left Ear: Tympanic membrane and ear canal normal.     Mouth/Throat:     Mouth: Mucous membranes are moist.     Pharynx: Oropharynx is clear. No oropharyngeal exudate or posterior oropharyngeal erythema.  Cardiovascular:     Rate and Rhythm: Normal rate and regular rhythm.     Heart sounds: Normal heart sounds.  Pulmonary:     Effort: Pulmonary effort is normal.     Breath sounds: Normal breath sounds.  Abdominal:     General: Abdomen is flat. Bowel sounds are normal.     Palpations: Abdomen is soft.  Lymphadenopathy:     Cervical: No cervical adenopathy.  Skin:    General: Skin is warm and dry.  Neurological:     Mental Status: She is alert and oriented to person, place, and time. Mental status is at baseline.  Psychiatric:        Mood and Affect: Mood normal.        Behavior: Behavior normal.      Depression Screen PHQ 2/9 Scores 04/18/2019 07/26/2018 04/23/2018 04/16/2018  PHQ - 2 Score 0 0 0 1    PHQ- 9 Score 3 5 4 6        Assessment & Plan:  Routine Health Maintenance and Physical Exam  Exercise Activities and Dietary recommendations Goals   None     Immunization History  Administered Date(s) Administered  . Influenza,inj,Quad PF,6+ Mos 05/31/2013, 06/02/2014, 04/16/2018  . Tdap 04/16/2018    Health Maintenance  Topic Date Due  . HIV Screening  11/29/1983  . COLONOSCOPY  11/29/2018  . INFLUENZA VACCINE  02/09/2019  . PAP SMEAR-Modifier  09/30/2019  . MAMMOGRAM  02/24/2021  . TETANUS/TDAP  04/16/2028     Discussed health benefits of physical activity, and encouraged her to engage in regular exercise appropriate for her age and condition.    1. Annual physical exam  - Comprehensive Metabolic Panel (CMET) - CBC with Differential - Lipid Profile - TSH - HIV antibody (with reflex)  2. Colon cancer screening  - Ambulatory referral to Gastroenterology  3. Need for influenza vaccination  - Flu Vaccine QUAD 36+ mos IM  4. Rash  - Ambulatory referral to Dermatology  5. Sore throat  - Ambulatory referral to ENT  The entirety of the information documented in the History of Present Illness, Review of Systems and Physical Exam were personally obtained by me. Portions of this information were initially documented by Rondel Baton, CMA and reviewed by me for thoroughness and accuracy.   F/u 1 year CPE --------------------------------------------------------------------    Trey Sailors, PA-C  [facility]

## 2019-04-18 NOTE — Patient Instructions (Signed)
Health Maintenance, Female Adopting a healthy lifestyle and getting preventive care are important in promoting health and wellness. Ask your health care provider about:  The right schedule for you to have regular tests and exams.  Things you can do on your own to prevent diseases and keep yourself healthy. What should I know about diet, weight, and exercise? Eat a healthy diet   Eat a diet that includes plenty of vegetables, fruits, low-fat dairy products, and lean protein.  Do not eat a lot of foods that are high in solid fats, added sugars, or sodium. Maintain a healthy weight Body mass index (BMI) is used to identify weight problems. It estimates body fat based on height and weight. Your health care provider can help determine your BMI and help you achieve or maintain a healthy weight. Get regular exercise Get regular exercise. This is one of the most important things you can do for your health. Most adults should:  Exercise for at least 150 minutes each week. The exercise should increase your heart rate and make you sweat (moderate-intensity exercise).  Do strengthening exercises at least twice a week. This is in addition to the moderate-intensity exercise.  Spend less time sitting. Even light physical activity can be beneficial. Watch cholesterol and blood lipids Have your blood tested for lipids and cholesterol at 50 years of age, then have this test every 5 years. Have your cholesterol levels checked more often if:  Your lipid or cholesterol levels are high.  You are older than 50 years of age.  You are at high risk for heart disease. What should I know about cancer screening? Depending on your health history and family history, you may need to have cancer screening at various ages. This may include screening for:  Breast cancer.  Cervical cancer.  Colorectal cancer.  Skin cancer.  Lung cancer. What should I know about heart disease, diabetes, and high blood  pressure? Blood pressure and heart disease  High blood pressure causes heart disease and increases the risk of stroke. This is more likely to develop in people who have high blood pressure readings, are of African descent, or are overweight.  Have your blood pressure checked: ? Every 3-5 years if you are 18-39 years of age. ? Every year if you are 40 years old or older. Diabetes Have regular diabetes screenings. This checks your fasting blood sugar level. Have the screening done:  Once every three years after age 40 if you are at a normal weight and have a low risk for diabetes.  More often and at a younger age if you are overweight or have a high risk for diabetes. What should I know about preventing infection? Hepatitis B If you have a higher risk for hepatitis B, you should be screened for this virus. Talk with your health care provider to find out if you are at risk for hepatitis B infection. Hepatitis C Testing is recommended for:  Everyone born from 1945 through 1965.  Anyone with known risk factors for hepatitis C. Sexually transmitted infections (STIs)  Get screened for STIs, including gonorrhea and chlamydia, if: ? You are sexually active and are younger than 50 years of age. ? You are older than 50 years of age and your health care provider tells you that you are at risk for this type of infection. ? Your sexual activity has changed since you were last screened, and you are at increased risk for chlamydia or gonorrhea. Ask your health care provider if   you are at risk.  Ask your health care provider about whether you are at high risk for HIV. Your health care provider may recommend a prescription medicine to help prevent HIV infection. If you choose to take medicine to prevent HIV, you should first get tested for HIV. You should then be tested every 3 months for as long as you are taking the medicine. Pregnancy  If you are about to stop having your period (premenopausal) and  you may become pregnant, seek counseling before you get pregnant.  Take 400 to 800 micrograms (mcg) of folic acid every day if you become pregnant.  Ask for birth control (contraception) if you want to prevent pregnancy. Osteoporosis and menopause Osteoporosis is a disease in which the bones lose minerals and strength with aging. This can result in bone fractures. If you are 65 years old or older, or if you are at risk for osteoporosis and fractures, ask your health care provider if you should:  Be screened for bone loss.  Take a calcium or vitamin D supplement to lower your risk of fractures.  Be given hormone replacement therapy (HRT) to treat symptoms of menopause. Follow these instructions at home: Lifestyle  Do not use any products that contain nicotine or tobacco, such as cigarettes, e-cigarettes, and chewing tobacco. If you need help quitting, ask your health care provider.  Do not use street drugs.  Do not share needles.  Ask your health care provider for help if you need support or information about quitting drugs. Alcohol use  Do not drink alcohol if: ? Your health care provider tells you not to drink. ? You are pregnant, may be pregnant, or are planning to become pregnant.  If you drink alcohol: ? Limit how much you use to 0-1 drink a day. ? Limit intake if you are breastfeeding.  Be aware of how much alcohol is in your drink. In the [place] one drink equals one 12 oz bottle of beer (355 mL), one 5 oz glass of wine (148 mL), or one 1 oz glass of hard liquor (44 mL). General instructions  Schedule regular health, dental, and eye exams.  Stay current with your vaccines.  Tell your health care provider if: ? You often feel depressed. ? You have ever been abused or do not feel safe at home. Summary  Adopting a healthy lifestyle and getting preventive care are important in promoting health and wellness.  Follow your health care provider's instructions about healthy  diet, exercising, and getting tested or screened for diseases.  Follow your health care provider's instructions on monitoring your cholesterol and blood pressure. This information is not intended to replace advice given to you by your health care provider. Make sure you discuss any questions you have with your health care provider. Document Released: 01/10/2011 Document Revised: 06/20/2018 Document Reviewed: 06/20/2018 Elsevier Patient Education  2020 Elsevier Inc.  

## 2019-04-19 LAB — CBC WITH DIFFERENTIAL/PLATELET
Basophils Absolute: 0 10*3/uL (ref 0.0–0.2)
Basos: 1 %
EOS (ABSOLUTE): 0.1 10*3/uL (ref 0.0–0.4)
Eos: 1 %
Hematocrit: 41.5 % (ref 34.0–46.6)
Hemoglobin: 13.7 g/dL (ref 11.1–15.9)
Immature Grans (Abs): 0 10*3/uL (ref 0.0–0.1)
Immature Granulocytes: 0 %
Lymphocytes Absolute: 1.7 10*3/uL (ref 0.7–3.1)
Lymphs: 32 %
MCH: 32.5 pg (ref 26.6–33.0)
MCHC: 33 g/dL (ref 31.5–35.7)
MCV: 98 fL — ABNORMAL HIGH (ref 79–97)
Monocytes Absolute: 0.4 10*3/uL (ref 0.1–0.9)
Monocytes: 8 %
Neutrophils Absolute: 3.1 10*3/uL (ref 1.4–7.0)
Neutrophils: 58 %
Platelets: 327 10*3/uL (ref 150–450)
RBC: 4.22 x10E6/uL (ref 3.77–5.28)
RDW: 12.5 % (ref 11.7–15.4)
WBC: 5.4 10*3/uL (ref 3.4–10.8)

## 2019-04-19 LAB — LIPID PANEL
Chol/HDL Ratio: 3.4 ratio (ref 0.0–4.4)
Cholesterol, Total: 191 mg/dL (ref 100–199)
HDL: 57 mg/dL (ref >39)
LDL Chol Calc (NIH): 102 mg/dL — ABNORMAL HIGH (ref 0–99)
Triglycerides: 185 mg/dL — ABNORMAL HIGH (ref 0–149)
VLDL Cholesterol Cal: 32 mg/dL (ref 5–40)

## 2019-04-19 LAB — COMPREHENSIVE METABOLIC PANEL
ALT: 16 IU/L (ref 0–32)
AST: 19 IU/L (ref 0–40)
Albumin/Globulin Ratio: 1.7 (ref 1.2–2.2)
Albumin: 4.2 g/dL (ref 3.8–4.8)
Alkaline Phosphatase: 73 IU/L (ref 39–117)
BUN/Creatinine Ratio: 11 (ref 9–23)
BUN: 11 mg/dL (ref 6–24)
Bilirubin Total: 0.4 mg/dL (ref 0.0–1.2)
CO2: 22 mmol/L (ref 20–29)
Calcium: 9.7 mg/dL (ref 8.7–10.2)
Chloride: 104 mmol/L (ref 96–106)
Creatinine, Ser: 1 mg/dL (ref 0.57–1.00)
GFR calc Af Amer: 76 mL/min/{1.73_m2} (ref >59)
GFR calc non Af Amer: 66 mL/min/{1.73_m2} (ref >59)
Globulin, Total: 2.5 g/dL (ref 1.5–4.5)
Glucose: 83 mg/dL (ref 65–99)
Potassium: 4.3 mmol/L (ref 3.5–5.2)
Sodium: 139 mmol/L (ref 134–144)
Total Protein: 6.7 g/dL (ref 6.0–8.5)

## 2019-04-19 LAB — TSH: TSH: 2.36 u[IU]/mL (ref 0.450–4.500)

## 2019-04-19 LAB — HIV ANTIBODY (ROUTINE TESTING W REFLEX): HIV Screen 4th Generation wRfx: NONREACTIVE

## 2019-04-23 ENCOUNTER — Other Ambulatory Visit: Payer: Self-pay

## 2019-04-23 ENCOUNTER — Telehealth: Payer: Self-pay

## 2019-04-23 DIAGNOSIS — Z1211 Encounter for screening for malignant neoplasm of colon: Secondary | ICD-10-CM

## 2019-04-23 MED ORDER — NA SULFATE-K SULFATE-MG SULF 17.5-3.13-1.6 GM/177ML PO SOLN: 1.0000 | Freq: Once | ORAL | 0 refills | Status: AC

## 2019-04-23 NOTE — Telephone Encounter (Signed)
Gastroenterology Pre-Procedure Review  Request Date: Wed 05/08/19 Requesting Physician: Dr. Bonna Gains  PATIENT REVIEW QUESTIONS: The patient responded to the following health history questions as indicated:    1. Are you having any GI issues? no 2. Do you have a personal history of Polyps? no 3. Do you have a family history of Colon Cancer or Polyps? no 4. Diabetes Mellitus? no 5. Joint replacements in the past [facility] 2019 6. Major health problems in the past 3 months?no 7. Any artificial heart valves, MVP, or defibrillator?no    MEDICATIONS & ALLERGIES:    Patient reports the following regarding taking any anticoagulation/antiplatelet therapy:   Plavix, Coumadin, Eliquis, Xarelto, Lovenox, Pradaxa, Brilinta, or Effient? no Aspirin? no  Patient confirms/reports the following medications:  Current Outpatient Medications  Medication Sig Dispense Refill  . escitalopram (LEXAPRO) 20 MG tablet Take 1 tablet (20 mg total) by mouth daily. (Patient taking differently: Take 20 mg by mouth daily. Takes 0.5 tablet daily) 90 tablet 3  . estrogens, conjugated, (PREMARIN) 0.625 MG tablet TAKE 1 TABLET (0.625 MG TOTAL) BY MOUTH DAILY. 90 tablet 3   No current facility-administered medications for this visit.     Patient confirms/reports the following allergies:  No Known Allergies  No orders of the defined types were placed in this encounter.   AUTHORIZATION INFORMATION Primary Insurance: 1D#: Group #:  Secondary Insurance: 1D#: Group #:  SCHEDULE INFORMATION: Date: 05/08/19 Wed Time: Location:[facility]

## 2019-05-03 ENCOUNTER — Other Ambulatory Visit
Admission: RE | Admit: 2019-05-03 | Discharge: 2019-05-03 | Disposition: A | Discharge status: Home / Self Care | Payer: Commercial Managed Care - PPO | Source: Ambulatory Visit | Attending: Gastroenterology | Admitting: Gastroenterology

## 2019-05-03 ENCOUNTER — Other Ambulatory Visit: Payer: Self-pay

## 2019-05-03 DIAGNOSIS — Z20828 Contact with and (suspected) exposure to other viral communicable diseases: Secondary | ICD-10-CM | POA: Diagnosis not present

## 2019-05-03 DIAGNOSIS — Z01812 Encounter for preprocedural laboratory examination: Secondary | ICD-10-CM | POA: Insufficient documentation

## 2019-05-03 LAB — SARS CORONAVIRUS 2 (TAT 6-24 HRS): SARS Coronavirus 2: NEGATIVE

## 2019-05-07 ENCOUNTER — Encounter: Payer: Self-pay | Admitting: *Deleted

## 2019-05-08 ENCOUNTER — Other Ambulatory Visit: Payer: Self-pay

## 2019-05-08 ENCOUNTER — Ambulatory Visit: Payer: Commercial Managed Care - PPO | Admitting: Certified Registered Nurse Anesthetist

## 2019-05-08 ENCOUNTER — Encounter
Admission: RE | Disposition: A | Discharge status: Home / Self Care | Payer: Self-pay | Source: Home / Self Care | Attending: Gastroenterology

## 2019-05-08 ENCOUNTER — Ambulatory Visit
Admission: RE | Admit: 2019-05-08 | Discharge: 2019-05-08 | Disposition: A | Discharge status: Home / Self Care | Payer: Commercial Managed Care - PPO | Attending: Gastroenterology | Admitting: Gastroenterology

## 2019-05-08 DIAGNOSIS — K6289 Other specified diseases of anus and rectum: Secondary | ICD-10-CM | POA: Insufficient documentation

## 2019-05-08 DIAGNOSIS — Z1211 Encounter for screening for malignant neoplasm of colon: Secondary | ICD-10-CM | POA: Diagnosis present

## 2019-05-08 DIAGNOSIS — Z79890 Hormone replacement therapy (postmenopausal): Secondary | ICD-10-CM | POA: Diagnosis not present

## 2019-05-08 DIAGNOSIS — Z79899 Other long term (current) drug therapy: Secondary | ICD-10-CM | POA: Insufficient documentation

## 2019-05-08 DIAGNOSIS — K648 Other hemorrhoids: Secondary | ICD-10-CM | POA: Insufficient documentation

## 2019-05-08 HISTORY — PX: COLONOSCOPY WITH PROPOFOL: SHX5780

## 2019-05-08 SURGERY — COLONOSCOPY WITH PROPOFOL
Anesthesia: General

## 2019-05-08 MED ORDER — SODIUM CHLORIDE 0.9 % IV SOLN
INTRAVENOUS | Status: DC
  Administered 2019-05-08: 08:00:00 1000 mL via INTRAVENOUS

## 2019-05-08 MED ORDER — PROPOFOL 10 MG/ML IV BOLUS
INTRAVENOUS | Status: DC | PRN
  Administered 2019-05-08: 30 mg via INTRAVENOUS
  Administered 2019-05-08 (×3): 20 mg via INTRAVENOUS
  Administered 2019-05-08: 60 mg via INTRAVENOUS
  Administered 2019-05-08 (×4): 20 mg via INTRAVENOUS

## 2019-05-08 MED ORDER — PROPOFOL 500 MG/50ML IV EMUL
INTRAVENOUS | Status: DC | PRN
  Administered 2019-05-08: 150 ug/kg/min via INTRAVENOUS

## 2019-05-08 MED ORDER — PROPOFOL 500 MG/50ML IV EMUL
INTRAVENOUS | Status: AC
  Filled 2019-05-08: qty 50

## 2019-05-08 MED ORDER — LIDOCAINE HCL (CARDIAC) PF 100 MG/5ML IV SOSY
PREFILLED_SYRINGE | INTRAVENOUS | Status: DC | PRN
  Administered 2019-05-08: 50 mg via INTRATRACHEAL

## 2019-05-08 MED ORDER — LIDOCAINE HCL (PF) 2 % IJ SOLN
INTRAMUSCULAR | Status: AC
  Filled 2019-05-08: qty 10

## 2019-05-08 NOTE — H&P (Signed)
Vonda Antigua, MD [address] [place], [place], [place], [address] [address], [place], [place], [place], [address] Phone: [phone]  Fax: [phone]  Primary Care Physician:  Trinna Post, PA-C   Pre-Procedure History & Physical: HPI:  Janice Strickland is a 48 y.o. female is here for a colonoscopy.   Past Medical History:  Diagnosis Date  . Trochanteric bursitis of left hip     Past Surgical History:  Procedure Laterality Date  . ABDOMINAL HYSTERECTOMY  2003  . bunion removal  06/28/2018   right foot    Prior to Admission medications   Medication Sig Start Date End Date Taking? Authorizing Provider  estrogens, conjugated, (PREMARIN) 0.625 MG tablet TAKE 1 TABLET (0.625 MG TOTAL) BY MOUTH DAILY. 04/23/18  Yes Malachy Mood, MD  escitalopram (LEXAPRO) 20 MG tablet Take 1 tablet (20 mg total) by mouth daily. Patient taking differently: Take 20 mg by mouth daily. Takes 0.5 tablet daily 04/23/18   Malachy Mood, MD    Allergies as of 04/23/2019  . (No Known Allergies)    Family History  Problem Relation Age of Onset  . Hypertension Mother   . Hypothyroidism Mother   . Heart disease Mother   . Healthy Father   . Hypertension Brother   . Breast cancer Neg Hx     Social History   Socioeconomic History  . Marital status: Married    Spouse name: Not on file  . Number of children: Not on file  . Years of education: Not on file  . Highest education level: Not on file  Occupational History  . Not on file  Social Needs  . Financial resource strain: Not on file  . Food insecurity    Worry: Not on file    Inability: Not on file  . Transportation needs    Medical: Not on file    Non-medical: Not on file  Tobacco Use  . Smoking status: Never Smoker  . Smokeless tobacco: Never Used  Substance and Sexual Activity  . Alcohol use: No    Alcohol/week: 0.0 standard drinks  . Drug use: No  . Sexual activity: Yes    Birth control/protection:  Surgical    Comment: Hysterectomy  Lifestyle  . Physical activity    Days per week: Not on file    Minutes per session: Not on file  . Stress: Not on file  Relationships  . Social Herbalist on phone: Not on file    Gets together: Not on file    Attends religious service: Not on file    Active member of club or organization: Not on file    Attends meetings of clubs or organizations: Not on file    Relationship status: Not on file  . Intimate partner violence    Fear of current or ex partner: Not on file    Emotionally abused: Not on file    Physically abused: Not on file    Forced sexual activity: Not on file  Other Topics Concern  . Not on file  Social History Narrative  . Not on file    Review of Systems: See HPI, otherwise negative ROS  Physical Exam: BP 114/79   Pulse 70   Temp (!) 96.8 F (36 C) (Tympanic)   Resp 20   Ht 5\' 4"  (1.626 m)   Wt 72.6 kg   SpO2 99%   BMI 27.46 kg/m  General:   Alert,  pleasant and cooperative in  NAD Head:  Normocephalic and atraumatic. Neck:  Supple; no masses or thyromegaly. Lungs:  Clear throughout to auscultation, normal respiratory effort.    Heart:  +S1, +S2, Regular rate and rhythm, No edema. Abdomen:  Soft, nontender and nondistended. Normal bowel sounds, without guarding, and without rebound.   Neurologic:  Alert and  oriented x4;  grossly normal neurologically.  Impression/Plan: Janice Strickland is here for a colonoscopy to be performed for average risk screening.  Risks, benefits, limitations, and alternatives regarding  colonoscopy have been reviewed with the patient.  Questions have been answered.  All parties agreeable.   Pasty Spillers, MD  05/08/2019, 9:14 AM

## 2019-05-08 NOTE — Anesthesia Preprocedure Evaluation (Signed)
Anesthesia Evaluation  Patient identified by MRN, date of birth, ID band Patient awake    Reviewed: Allergy & Precautions, NPO status , Patient's Chart, lab work & pertinent test results  History of Anesthesia Complications Negative for: history of anesthetic complications  Airway Mallampati: II       Dental   Pulmonary neg sleep apnea, neg COPD, Not current smoker,           Cardiovascular (-) hypertension(-) Past MI and (-) CHF (-) dysrhythmias (-) Valvular Problems/Murmurs     Neuro/Psych neg Seizures    GI/Hepatic Neg liver ROS, neg GERD  ,  Endo/Other  neg diabetes  Renal/GU negative Renal ROS     Musculoskeletal   Abdominal   Peds  Hematology   Anesthesia Other Findings   Reproductive/Obstetrics                             Anesthesia Physical Anesthesia Plan  ASA: I  Anesthesia Plan: General   Post-op Pain Management:    Induction: Intravenous  PONV Risk Score and Plan: 3 and TIVA, Propofol infusion and Ondansetron  Airway Management Planned: Nasal Cannula  Additional Equipment:   Intra-op Plan:   Post-operative Plan:   Informed Consent: I have reviewed the patients History and Physical, chart, labs and discussed the procedure including the risks, benefits and alternatives for the proposed anesthesia with the patient or authorized representative who has indicated his/her understanding and acceptance.       Plan Discussed with:   Anesthesia Plan Comments:         Anesthesia Quick Evaluation

## 2019-05-08 NOTE — Anesthesia Postprocedure Evaluation (Signed)
Anesthesia Post Note  [name]  Procedure(s) Performed: COLONOSCOPY WITH PROPOFOL (N/A )  Patient location during evaluation: Endoscopy Anesthesia Type: General Level of consciousness: awake and alert Pain management: pain level controlled Vital Signs Assessment: post-procedure vital signs reviewed and stable Respiratory status: spontaneous breathing and respiratory function stable Cardiovascular status: stable Anesthetic complications: no     Last Vitals:  Vitals:   05/08/19 1004 05/08/19 1014  BP: 94/64 107/86  Pulse: 73 65  Resp: 16 15  Temp:    SpO2: 97% 98%    Last Pain:  Vitals:   05/08/19 1014  TempSrc:   PainSc: 0-No pain                 KEPHART,WILLIAM K

## 2019-05-08 NOTE — Transfer of Care (Signed)
Immediate Anesthesia Transfer of Care Note  [name]  Procedure(s) Performed: COLONOSCOPY WITH PROPOFOL (N/A )  Patient Location: PACU  Anesthesia Type:General  Level of Consciousness: sedated  Airway & Oxygen Therapy: Patient Spontanous Breathing and Patient connected to nasal cannula oxygen  Post-op Assessment: Report given to RN and Post -op Vital signs reviewed and stable  Post vital signs: Reviewed and stable  Last Vitals:  Vitals Value Taken Time  BP 96/71 05/08/19 0945  Temp    Pulse 82 05/08/19 0945  Resp 16 05/08/19 0945  SpO2 97 % 05/08/19 0945  Vitals shown include unvalidated device data.  Last Pain:  Vitals:   05/08/19 0807  TempSrc: Tympanic  PainSc: 0-No pain         Complications: No apparent anesthesia complications

## 2019-05-08 NOTE — Anesthesia Post-op Follow-up Note (Signed)
Anesthesia QCDR form completed.        

## 2019-05-08 NOTE — Op Note (Signed)
[facility] Patient Name: Janice Strickland Procedure Date: 05/08/2019 8:55 AM MRN: [id number] Account #: [id number] Date of Birth: 08/01/1970 Admit Type: Outpatient Age: 48 Room: [facility] ENDO ROOM 2 Gender: Female Note Status: Finalized Procedure:            Colonoscopy Indications:          Screening for colorectal malignant neoplasm Providers:            Varnita B. Bonna Gains MD, MD Medicines:            Monitored Anesthesia Care Complications:        No immediate complications. Procedure:            Pre-Anesthesia Assessment:                       - Prior to the procedure, a History and Physical was                        performed, and patient medications, allergies and                        sensitivities were reviewed. The patient's tolerance of                        previous anesthesia was reviewed.                       - The risks and benefits of the procedure and the                        sedation options and risks were discussed with the                        patient. All questions were answered and informed                        consent was obtained.                       - Patient identification and proposed procedure were                        verified prior to the procedure by the physician, the                        nurse, the anesthetist and the technician. The                        procedure was verified in the pre-procedure area in the                        procedure room in the endoscopy suite.                       - ASA Grade Assessment: II - A patient with mild                        systemic disease.                       - After reviewing the risks and benefits, the patient  was deemed in satisfactory condition to undergo the                        procedure.                       After obtaining informed consent, the colonoscope was                        passed under direct vision. Throughout the  procedure,                        the patient's blood pressure, pulse, and oxygen                        saturations were monitored continuously. The                        Colonoscope was introduced through the anus and                        advanced to the the terminal ileum, with identification                        of the appendiceal orifice and IC valve. The                        colonoscopy was performed with ease. The patient                        tolerated the procedure well. The quality of the bowel                        preparation was fair. Findings:      The perianal and digital rectal examinations were normal.      The rectum, sigmoid colon, descending colon, transverse colon, ascending       colon and cecum appeared normal.      Anal papilla(e) were hypertrophied.      Non-bleeding internal hemorrhoids were found during retroflexion. Impression:           - Preparation of the colon was fair.                       - The rectum, sigmoid colon, descending colon,                        transverse colon, ascending colon and cecum are normal.                       - Anal papilla(e) were hypertrophied.                       - Non-bleeding internal hemorrhoids.                       - No specimens collected. Recommendation:       - Discharge patient to home.                       - Resume previous diet.                       -  Continue present medications.                       - Repeat colonoscopy in 3 years, with a 2 day prep,                        because the bowel preparation was suboptimal.                       - Return to primary care physician as previously                        scheduled.                       - The findings and recommendations were discussed with                        the patient.                       - The findings and recommendations were discussed with                        the patient's family.                       - High fiber  diet. Procedure Code(s):    --- Professional ---                       B0962, Colorectal cancer screening; colonoscopy on                        individual not meeting criteria for high risk Diagnosis Code(s):    --- Professional ---                       Z12.11, Encounter for screening for malignant neoplasm                        of colon CPT copyright 2019 American Medical Association. All rights reserved. The codes documented in this report are preliminary and upon coder review may  be revised to meet current compliance requirements.  Melodie Bouillon, MD Michel Bickers B. Maximino Greenland MD, MD 05/08/2019 9:51:48 AM This report has been signed electronically. Number of Addenda: 0 Note Initiated On: 05/08/2019 8:55 AM Scope Withdrawal Time: 0 hours 16 minutes 51 seconds  Total Procedure Duration: 0 hours 25 minutes 43 seconds       [facility]

## 2019-05-09 ENCOUNTER — Encounter: Payer: Self-pay | Admitting: Gastroenterology

## 2019-05-16 ENCOUNTER — Encounter: Payer: Self-pay | Admitting: *Deleted

## 2019-05-16 ENCOUNTER — Other Ambulatory Visit: Payer: Self-pay

## 2019-05-21 ENCOUNTER — Other Ambulatory Visit
Admission: RE | Admit: 2019-05-21 | Discharge: 2019-05-21 | Disposition: A | Discharge status: Home / Self Care | Payer: Commercial Managed Care - PPO | Source: Ambulatory Visit | Attending: Unknown Physician Specialty | Admitting: Unknown Physician Specialty

## 2019-05-21 DIAGNOSIS — Z20828 Contact with and (suspected) exposure to other viral communicable diseases: Secondary | ICD-10-CM | POA: Diagnosis not present

## 2019-05-21 DIAGNOSIS — Z01812 Encounter for preprocedural laboratory examination: Secondary | ICD-10-CM | POA: Insufficient documentation

## 2019-05-21 LAB — SARS CORONAVIRUS 2 (TAT 6-24 HRS): SARS Coronavirus 2: NEGATIVE

## 2019-05-23 NOTE — Discharge Instructions (Signed)
T & A INSTRUCTION SHEET - [facility]  Linus Salmons, MD  [address] [place], [place] [address] TEL.  [phone]  INFORMATION SHEET FOR A TONSILLECTOMY AND ADENDOIDECTOMY  About Your Tonsils and Adenoids  The tonsils and adenoids are normal body tissues that are part of our immune system.  They normally help to protect [place] against diseases that may enter our mouth and nose. However, sometimes the tonsils and/or adenoids become too large and obstruct our breathing, especially at night.    If either of these things happen it helps to remove the tonsils and adenoids in order to become healthier. The operation to remove the tonsils and adenoids is called a tonsillectomy and adenoidectomy.  The Location of Your Tonsils and Adenoids  The tonsils are located in the back of the throat on both side and sit in a cradle of muscles. The adenoids are located in the roof of the mouth, behind the nose, and closely associated with the opening of the Eustachian tube to the ear.  Surgery on Tonsils and Adenoids  A tonsillectomy and adenoidectomy is a short operation which takes about thirty minutes.  This includes being put to sleep and being awakened. Tonsillectomies and adenoidectomies are performed at [facility] and may require observation period in the recovery room prior to going home. Children are required to remain in recovery for at least 45 minutes.   Following the Operation for a Tonsillectomy  A cautery machine is used to control bleeding. Bleeding from a tonsillectomy and adenoidectomy is minimal and postoperatively the risk of bleeding is approximately four percent, although this rarely life threatening.  After your tonsillectomy and adenoidectomy post-op care at home: 1. Our patients are able to go home the same day. You may be given prescriptions for pain medications, if indicated. 2. It is extremely important to  remember that fluid intake is of utmost importance after a tonsillectomy. The amount that you drink must be maintained in the postoperative period. A good indication of whether a child is getting enough fluid is whether his/her urine output is constant. As long as children are urinating or wetting their diaper every 6 - 8 hours this is usually enough fluid intake.   3. Although rare, this is a risk of some bleeding in the first ten days after surgery. This usually occurs between day five and nine postoperatively. This risk of bleeding is approximately four percent. If you or your child should have any bleeding you should remain calm and notify our office or go directly to the emergency room at [facility] where they will contact us. Our doctors are available seven days a week for notification. We recommend sitting up quietly in a chair, place an ice pack on the front of the neck and spitting out the blood gently until we are able to contact you. Adults should gargle gently with ice water and this may help stop the bleeding. If the bleeding does not stop after a short time, i.e. 10 to 15 minutes, or seems to be increasing again, please contact us or go to the hospital.   4. It is common for the pain to be worse at 5 - 7 days postoperatively. This occurs because the scab is peeling off and the mucous membrane (skin of the throat) is growing back where the tonsils were.   5. It is common for a low-grade fever, less than 102, during the first week  after a tonsillectomy and adenoidectomy. It is usually due to not drinking enough liquids, and we suggest your use liquid Tylenol (acetaminophen) or the pain medicine with Tylenol (acetaminophen) prescribed in order to keep your temperature below 102. Please follow the directions on the back of the bottle. 6. Recommendations for post-operative pain in children and adults: a) For Children 12 and younger: Recommendations are for oral Tylenol  (acetaminophen) and oral Motrin (Ibuprofen). Administer the Tylenol (acetaminophen) and Motrin (ibuprofen) as stated on bottle for patient's age/weight. Sometimes it may be necessary to alternate the Tylenol (acetaminophen) and Motrin for improved pain control. Motrin does last slightly longer so many patients benefit from being given this prior to bedtime. All children should avoid Aspirin products for 2 weeks following surgery. b) For children over the age of 105: Tylenol (acetaminophen) is the preferred first choice for pain control. Depending on your child's size, sometimes they will be given a combination of Tylenol (acetaminophen) and hydrocodone medication or sometimes it will be recommended they take Motrin (ibuprofen) in addition to the Tylenol (acetaminophen). Narcotics should always be used with caution in children following surgery as they can suppress their breathing and switching to over the counter Tylenol (acetaminophen) and Motrin (ibuprofen) as soon as possible is recommended. All patients should avoid Aspirin products for 2 weeks following surgery. c) Adults: Usually adults will require a narcotic pain medication following a tonsillectomy. This usually has either hydrocodone or oxycodone in it and can usually be taken every 4 to 6 hours as needed for moderate pain. If the medication does not have Tylenol (acetaminophen) in it, you may also supplement Tylenol (acetaminophen) as needed every 4 to 6 hours for breakthrough or mild pain. Adults should avoid Aspirin, Aleve, Motrin, and Ibuprofen products for 2 weeks following surgery as they can increase your risk of bleeding. 7. If you happen to look in the mirror or into your child's mouth you will see white/gray patches on the back of the throat. This is what a scab looks like in the mouth and is normal after having a tonsillectomy and adenoidectomy. They will disappear once the tonsil areas heal completely. However, it may cause a noticeable odor,  and this too will disappear with time.     8. You or your child may experience ear pain after having a tonsillectomy and adenoidectomy.  This is called referred pain and comes from the throat, but it is felt in the ears.  Ear pain is quite common and expected. It will usually go away after ten days. There is usually nothing wrong with the ears, and it is primarily due to the healing area stimulating the nerve to the ear that runs along the side of the throat. Use either the prescribed pain medicine or Tylenol (acetaminophen) as needed.  9. The throat tissues after a tonsillectomy are obviously sensitive. Smoking around children who have had a tonsillectomy significantly increases the risk of bleeding. DO NOT SMOKE!  General Anesthesia, Adult, Care After This sheet gives you information about how to care for yourself after your procedure. Your health care provider may also give you more specific instructions. If you have problems or questions, contact your health care provider. What can I expect after the procedure? After the procedure, the following side effects are common:  Pain or discomfort at the IV site.  Nausea.  Vomiting.  Sore throat.  Trouble concentrating.  Feeling cold or chills.  Weak or tired.  Sleepiness and fatigue.  Soreness and body aches.  These side effects can affect parts of the body that were not involved in surgery. Follow these instructions at home:  For at least 24 hours after the procedure:  Have a responsible adult stay with you. It is important to have someone help care for you until you are awake and alert.  Rest as needed.  Do not: ? Participate in activities in which you could fall or become injured. ? Drive. ? Use heavy machinery. ? Drink alcohol. ? Take sleeping pills or medicines that cause drowsiness. ? Make important decisions or sign legal documents. ? Take care of children on your own. Eating and drinking  Follow any instructions from  your health care provider about eating or drinking restrictions.  When you feel hungry, start by eating small amounts of foods that are soft and easy to digest (bland), such as toast. Gradually return to your regular diet.  Drink enough fluid to keep your urine pale yellow.  If you vomit, rehydrate by drinking water, juice, or clear broth. General instructions  If you have sleep apnea, surgery and certain medicines can increase your risk for breathing problems. Follow instructions from your health care provider about wearing your sleep device: ? Anytime you are sleeping, including during daytime naps. ? While taking prescription pain medicines, sleeping medicines, or medicines that make you drowsy.  Return to your normal activities as told by your health care provider. Ask your health care provider what activities are safe for you.  Take over-the-counter and prescription medicines only as told by your health care provider.  If you smoke, do not smoke without supervision.  Keep all follow-up visits as told by your health care provider. This is important. Contact a health care provider if:  You have nausea or vomiting that does not get better with medicine.  You cannot eat or drink without vomiting.  You have pain that does not get better with medicine.  You are unable to pass urine.  You develop a skin rash.  You have a fever.  You have redness around your IV site that gets worse. Get help right away if:  You have difficulty breathing.  You have chest pain.  You have blood in your urine or stool, or you vomit blood. Summary  After the procedure, it is common to have a sore throat or nausea. It is also common to feel tired.  Have a responsible adult stay with you for the first 24 hours after general anesthesia. It is important to have someone help care for you until you are awake and alert.  When you feel hungry, start by eating small amounts of foods that are soft and  easy to digest (bland), such as toast. Gradually return to your regular diet.  Drink enough fluid to keep your urine pale yellow.  Return to your normal activities as told by your health care provider. Ask your health care provider what activities are safe for you. This information is not intended to replace advice given to you by your health care provider. Make sure you discuss any questions you have with your health care provider. Document Released: 10/03/2000 Document Revised: 06/30/2017 Document Reviewed: 02/10/2017 Elsevier Patient Education  2020 [employer].

## 2019-05-24 ENCOUNTER — Ambulatory Visit: Payer: Commercial Managed Care - PPO | Admitting: Anesthesiology

## 2019-05-24 ENCOUNTER — Encounter
Admission: RE | Disposition: A | Discharge status: Home / Self Care | Payer: Self-pay | Source: Home / Self Care | Attending: Unknown Physician Specialty

## 2019-05-24 ENCOUNTER — Other Ambulatory Visit: Payer: Self-pay

## 2019-05-24 ENCOUNTER — Ambulatory Visit
Admission: RE | Admit: 2019-05-24 | Discharge: 2019-05-24 | Disposition: A | Discharge status: Home / Self Care | Payer: Commercial Managed Care - PPO | Attending: Unknown Physician Specialty | Admitting: Unknown Physician Specialty

## 2019-05-24 DIAGNOSIS — Z79890 Hormone replacement therapy (postmenopausal): Secondary | ICD-10-CM | POA: Diagnosis not present

## 2019-05-24 DIAGNOSIS — J3501 Chronic tonsillitis: Secondary | ICD-10-CM | POA: Diagnosis not present

## 2019-05-24 HISTORY — DX: Dizziness and giddiness: R42

## 2019-05-24 HISTORY — DX: Motion sickness, initial encounter: T75.3XXA

## 2019-05-24 HISTORY — PX: TONSILLECTOMY: SHX5217

## 2019-05-24 SURGERY — TONSILLECTOMY
Anesthesia: General | Site: Throat | Laterality: Bilateral

## 2019-05-24 MED ORDER — ONDANSETRON HCL 4 MG/2ML IJ SOLN
INTRAMUSCULAR | Status: DC | PRN
  Administered 2019-05-24: 4 mg via INTRAVENOUS

## 2019-05-24 MED ORDER — OXYCODONE HCL 5 MG PO TABS: 5.0000 mg | ORAL_TABLET | Freq: Once | ORAL | Status: AC | PRN

## 2019-05-24 MED ORDER — MIDAZOLAM HCL 5 MG/5ML IJ SOLN
INTRAMUSCULAR | Status: DC | PRN
  Administered 2019-05-24: 2 mg via INTRAVENOUS

## 2019-05-24 MED ORDER — BUPIVACAINE HCL (PF) 0.5 % IJ SOLN
INTRAMUSCULAR | Status: DC | PRN
  Administered 2019-05-24: 6 mL

## 2019-05-24 MED ORDER — FENTANYL CITRATE (PF) 100 MCG/2ML IJ SOLN: 25.0000 ug | INTRAMUSCULAR | Status: DC | PRN

## 2019-05-24 MED ORDER — HYDROCODONE-ACETAMINOPHEN 7.5-325 MG/15ML PO SOLN: 15.0000 mL | Freq: Four times a day (QID) | ORAL | 0 refills | Status: DC | PRN

## 2019-05-24 MED ORDER — SCOPOLAMINE 1 MG/3DAYS TD PT72
1.0000 | MEDICATED_PATCH | Freq: Once | TRANSDERMAL | Status: DC
  Administered 2019-05-24: 1.5 mg via TRANSDERMAL

## 2019-05-24 MED ORDER — ACETAMINOPHEN 10 MG/ML IV SOLN
1000.0000 mg | Freq: Once | INTRAVENOUS | Status: AC
  Administered 2019-05-24: 1000 mg via INTRAVENOUS

## 2019-05-24 MED ORDER — LACTATED RINGERS IV SOLN
10.0000 mL/h | INTRAVENOUS | Status: DC
  Administered 2019-05-24: 10 mL/h via INTRAVENOUS

## 2019-05-24 MED ORDER — SUCCINYLCHOLINE CHLORIDE 20 MG/ML IJ SOLN
INTRAMUSCULAR | Status: DC | PRN
  Administered 2019-05-24: 100 mg via INTRAVENOUS

## 2019-05-24 MED ORDER — ONDANSETRON HCL 4 MG/2ML IJ SOLN: 4.0000 mg | Freq: Once | INTRAMUSCULAR | Status: DC | PRN

## 2019-05-24 MED ORDER — DEXAMETHASONE SODIUM PHOSPHATE 4 MG/ML IJ SOLN
INTRAMUSCULAR | Status: DC | PRN
  Administered 2019-05-24: 10 mg via INTRAVENOUS

## 2019-05-24 MED ORDER — PROPOFOL 10 MG/ML IV BOLUS
INTRAVENOUS | Status: DC | PRN
  Administered 2019-05-24: 140 mg via INTRAVENOUS

## 2019-05-24 MED ORDER — GLYCOPYRROLATE 0.2 MG/ML IJ SOLN
INTRAMUSCULAR | Status: DC | PRN
  Administered 2019-05-24: 0.1 mg via INTRAVENOUS

## 2019-05-24 MED ORDER — LIDOCAINE HCL (CARDIAC) PF 100 MG/5ML IV SOSY
PREFILLED_SYRINGE | INTRAVENOUS | Status: DC | PRN
  Administered 2019-05-24: 50 mg via INTRAVENOUS

## 2019-05-24 MED ORDER — FENTANYL CITRATE (PF) 100 MCG/2ML IJ SOLN
INTRAMUSCULAR | Status: DC | PRN
  Administered 2019-05-24: 100 ug via INTRAVENOUS

## 2019-05-24 MED ORDER — OXYCODONE HCL 5 MG/5ML PO SOLN
5.0000 mg | Freq: Once | ORAL | Status: AC | PRN
  Administered 2019-05-24: 09:00:00 5 mg via ORAL

## 2019-05-24 SURGICAL SUPPLY — 21 items
"PENCIL ELECTRO HAND CTR " (MISCELLANEOUS) ×1 IMPLANT
CANISTER SUCT 1200ML W/VALVE (MISCELLANEOUS) ×3 IMPLANT
COAG SUCT 10F 3.5MM HAND CTRL (MISCELLANEOUS) ×3 IMPLANT
DRAPE HEAD BAR (DRAPES) ×3 IMPLANT
ELECT CAUTERY BLADE TIP 2.5 (TIP) ×3
ELECT REM PT RETURN 9FT ADLT (ELECTROSURGICAL) ×3
ELECTRODE CAUTERY BLDE TIP 2.5 (TIP) ×1 IMPLANT
ELECTRODE REM PT RTRN 9FT ADLT (ELECTROSURGICAL) ×1 IMPLANT
GLOVE BIO SURGEON STRL SZ7.5 (GLOVE) ×3 IMPLANT
HANDLE SUCTION POOLE (INSTRUMENTS) ×1 IMPLANT
KIT TURNOVER KIT A (KITS) ×3 IMPLANT
NDL HYPO 25GX1X1/2 BEV (NEEDLE) ×1 IMPLANT
NEEDLE HYPO 25GX1X1/2 BEV (NEEDLE) ×3 IMPLANT
NS IRRIG 500ML POUR BTL (IV SOLUTION) ×3 IMPLANT
PACK TONSIL AND ADENOID CUSTOM (PACKS) ×3 IMPLANT
PENCIL ELECTRO HAND CTR (MISCELLANEOUS) ×3 IMPLANT
SPONGE TONSIL .75 RFD DBL STRL (DISPOSABLE) ×3 IMPLANT
STRAP BODY AND KNEE 60X3 (MISCELLANEOUS) ×3 IMPLANT
SUCTION POOLE HANDLE (INSTRUMENTS) ×3
SYR 10ML LL (SYRINGE) ×2 IMPLANT
SYR 5ML LL (SYRINGE) ×3 IMPLANT

## 2019-05-24 NOTE — Anesthesia Postprocedure Evaluation (Signed)
Anesthesia Post Note  [name]  Procedure(s) Performed: TONSILLECTOMY (Bilateral Throat)     Anesthesia Post Evaluation  Raliegh Ip

## 2019-05-24 NOTE — Transfer of Care (Signed)
Immediate Anesthesia Transfer of Care Note  [name]  Procedure(s) Performed: TONSILLECTOMY (Bilateral Throat)  Patient Location: PACU  Anesthesia Type: General ETT  Level of Consciousness: awake, alert  and patient cooperative  Airway and Oxygen Therapy: Patient Spontanous Breathing and Patient connected to supplemental oxygen  Post-op Assessment: Post-op Vital signs reviewed, Patient's Cardiovascular Status Stable, Respiratory Function Stable, Patent Airway and No signs of Nausea or vomiting  Post-op Vital Signs: Reviewed and stable  Complications: No apparent anesthesia complications

## 2019-05-24 NOTE — Op Note (Signed)
PREOPERATIVE DIAGNOSIS:  CHRONIC TONSILLITIS  POSTOPERATIVE DIAGNOSIS:  Chronic Tonsillitis  OPERATION:  Tonsillectomy.  SURGEON:  Roena Malady, MD  ANESTHESIA:  General endotracheal.  OPERATIVE FINDINGS:  Large tonsils.  DESCRIPTION OF THE PROCEDURE: DOROTHYMAE MACIVER was identified in the holding area and taken to the operating room and placed in the supine position.  After general endotracheal anesthesia, the table was turned 45 degrees and the patient was draped in the usual fashion for a tonsillectomy.  A mouth gag was inserted into the oral cavity.  There were large tonsils.  Beginning on the left-hand side a tenaculum was used to grasp the tonsil and the Bovie cautery was used to dissect it free from the fossa.  In a similar fashion, the right tonsil was removed.  Meticulous hemostasis was achieved using the Bovie cautery.  With both tonsils removed and no active bleeding, 0.5% plain Marcaine was used to inject the anterior and posterior tonsillar pillars bilaterally.  A total of 38ml was used.  The patient tolerated the procedure well and was awakened in the operating room and taken to the recovery room in stable condition.   CULTURES:  None.  SPECIMENS:  Tonsils.  ESTIMATED BLOOD LOSS:  Less than 10 ml.  Roena Malady  05/24/2019  8:48 AM

## 2019-05-24 NOTE — Anesthesia Preprocedure Evaluation (Signed)
Anesthesia Evaluation  Patient identified by MRN, date of birth, ID band Patient awake    Reviewed: Allergy & Precautions, H&P , NPO status , Patient's Chart, lab work & pertinent test results  Airway Mallampati: II  TM Distance: >3 FB Neck ROM: full    Dental no notable dental hx.    Pulmonary    Pulmonary exam normal breath sounds clear to auscultation       Cardiovascular Normal cardiovascular exam Rhythm:regular Rate:Normal     Neuro/Psych PSYCHIATRIC DISORDERS    GI/Hepatic   Endo/Other    Renal/GU      Musculoskeletal   Abdominal   Peds  Hematology   Anesthesia Other Findings   Reproductive/Obstetrics                             Anesthesia Physical Anesthesia Plan  ASA: II  Anesthesia Plan: General ETT   Post-op Pain Management:    Induction:   PONV Risk Score and Plan: 3 and Scopolamine patch - Pre-op, Ondansetron, Dexamethasone and Treatment may vary due to age or medical condition  Airway Management Planned:   Additional Equipment:   Intra-op Plan:   Post-operative Plan:   Informed Consent: I have reviewed the patients History and Physical, chart, labs and discussed the procedure including the risks, benefits and alternatives for the proposed anesthesia with the patient or authorized representative who has indicated his/her understanding and acceptance.       Plan Discussed with: CRNA  Anesthesia Plan Comments:         Anesthesia Quick Evaluation

## 2019-05-24 NOTE — H&P (Signed)
The patient's history has been reviewed, patient examined, no change in status, stable for surgery.  Questions were answered to the patients satisfaction.  

## 2019-05-24 NOTE — Anesthesia Procedure Notes (Signed)
Procedure Name: Intubation Date/Time: 05/24/2019 8:33 AM Performed by: Mayme Genta, CRNA Pre-anesthesia Checklist: Patient identified, Emergency Drugs available, Suction available, Patient being monitored and Timeout performed Patient Re-evaluated:Patient Re-evaluated prior to induction Oxygen Delivery Method: Circle system utilized Preoxygenation: Pre-oxygenation with 100% oxygen Induction Type: IV induction Ventilation: Mask ventilation without difficulty Laryngoscope Size: Ammons and 2 Grade View: Grade I Tube type: Oral Rae Tube size: 7.0 mm Number of attempts: 1 Placement Confirmation: ETT inserted through vocal cords under direct vision,  positive ETCO2 and breath sounds checked- equal and bilateral Tube secured with: Tape Dental Injury: Teeth and Oropharynx as per pre-operative assessment

## 2019-05-28 LAB — SURGICAL PATHOLOGY

## 2019-06-24 ENCOUNTER — Telehealth: Payer: Self-pay

## 2019-06-24 NOTE — Telephone Encounter (Signed)
Jenn calling from [employer]. They work with this patient's insurance. She faxed over a change request on 06/18/2019 and called her on 06/21/2019 & left a voicemail. She just spoke with the pharmacy and they have not received the new rx yet. Rx Premarin 0.625mg  #[facility] at [facility]. She is requesting a lower cost option on this medication. She is requesting generic estradiol 1 mg #90. This would take her co-pay from $50 to $7. If approved AMS can send new rx for Estradiol sent to [facility]. If denied please let them know so they can update their records and not continue to contact us.

## 2019-06-25 ENCOUNTER — Other Ambulatory Visit: Payer: Self-pay | Admitting: Obstetrics and Gynecology

## 2019-06-25 MED ORDER — ESTRADIOL 1 MG PO TABS: 1.0000 mg | ORAL_TABLET | Freq: Every day | ORAL | 3 refills | Status: DC

## 2019-06-25 NOTE — Telephone Encounter (Signed)
advise

## 2019-06-27 ENCOUNTER — Other Ambulatory Visit: Payer: Self-pay | Admitting: Obstetrics and Gynecology

## 2019-07-23 ENCOUNTER — Other Ambulatory Visit: Payer: Self-pay

## 2019-07-23 ENCOUNTER — Encounter: Payer: Self-pay | Admitting: Obstetrics and Gynecology

## 2019-07-23 ENCOUNTER — Ambulatory Visit (INDEPENDENT_AMBULATORY_CARE_PROVIDER_SITE_OTHER): Payer: BC Managed Care – PPO | Admitting: Obstetrics and Gynecology

## 2019-07-23 VITALS — BP 120/80 | HR 97 | Ht 65.0 in | Wt 165.0 lb

## 2019-07-23 DIAGNOSIS — Z01419 Encounter for gynecological examination (general) (routine) without abnormal findings: Secondary | ICD-10-CM | POA: Diagnosis not present

## 2019-07-23 DIAGNOSIS — Z1239 Encounter for other screening for malignant neoplasm of breast: Secondary | ICD-10-CM

## 2019-07-23 NOTE — Progress Notes (Signed)
Gynecology Annual Exam  PCP: Trey Sailors, PA-C  Chief Complaint:  Chief Complaint  Patient presents with  . Gynecologic Exam    History of Present Illness:Patient is a 49 y.o. [id number] presents for annual exam. The patient has no complaints today.   LMP: No LMP recorded. Patient has had a hysterectomy.   The patient is sexually active. She denies dyspareunia.  The patient does perform self breast exams.  There is no notable family history of breast or ovarian cancer in her family.  The patient wears seatbelts: yes.   The patient has regular exercise: not asked.    The patient denies current symptoms of depression.     Review of Systems: Review of Systems  Constitutional: Negative for chills and fever.  HENT: Negative for congestion.   Respiratory: Negative for cough and shortness of breath.   Cardiovascular: Negative for chest pain and palpitations.  Gastrointestinal: Negative for abdominal pain, constipation, diarrhea, heartburn, nausea and vomiting.  Genitourinary: Negative for dysuria, frequency and urgency.  Skin: Negative for itching and rash.  Neurological: Negative for dizziness and headaches.  Endo/Heme/Allergies: Negative for polydipsia.  Psychiatric/Behavioral: Negative for depression.    Past Medical History:  Past Medical History:  Diagnosis Date  . Motion sickness    back seat - cars  . Trochanteric bursitis of left hip   . Vertigo    during seasonal allergy season    Past Surgical History:  Past Surgical History:  Procedure Laterality Date  . ABDOMINAL HYSTERECTOMY  2003  . bunion removal  06/28/2018   right foot  . COLONOSCOPY WITH PROPOFOL N/A 05/08/2019   Procedure: COLONOSCOPY WITH PROPOFOL;  Surgeon: Pasty Spillers, MD;  Location: [facility];  Service: Endoscopy;  Laterality: N/A;  . TONSILLECTOMY Bilateral 05/24/2019   Procedure: TONSILLECTOMY;  Surgeon: Linus Salmons, MD;  Location: [facility];  Service: ENT;   Laterality: Bilateral;    Gynecologic History:  No LMP recorded. Patient has had a hysterectomy. Last Pap: Results were: N/A prior hysterectomy Last mammogram: 02/25/2019 Results were: BI-RAD I  Obstetric History: [id number]  Family History:  Family History  Problem Relation Age of Onset  . Hypertension Mother   . Hypothyroidism Mother   . Heart disease Mother   . Healthy Father   . Hypertension Brother   . Breast cancer Neg Hx     Social History:  Social History   Socioeconomic History  . Marital status: Married    Spouse name: Not on file  . Number of children: Not on file  . Years of education: Not on file  . Highest education level: Not on file  Occupational History  . Not on file  Tobacco Use  . Smoking status: Never Smoker  . Smokeless tobacco: Never Used  Substance and Sexual Activity  . Alcohol use: No    Alcohol/week: 0.0 standard drinks    Comment: may have a drink on Holidays  . Drug use: No  . Sexual activity: Yes    Birth control/protection: Surgical    Comment: Hysterectomy  Other Topics Concern  . Not on file  Social History Narrative  . Not on file   Social Determinants of Health   Financial Resource Strain:   . Difficulty of Paying Living Expenses: Not on file  Food Insecurity:   . Worried About Programme researcher, broadcasting/film/video in the Last Year: Not on file  . Ran Out of Food in the Last Year: Not on file  Transportation  Needs:   . Lack of Transportation (Medical): Not on file  . Lack of Transportation (Non-Medical): Not on file  Physical Activity:   . Days of Exercise per Week: Not on file  . Minutes of Exercise per Session: Not on file  Stress:   . Feeling of Stress : Not on file  Social Connections:   . Frequency of Communication with Friends and Family: Not on file  . Frequency of Social Gatherings with Friends and Family: Not on file  . Attends Religious Services: Not on file  . Active Member of Clubs or Organizations: Not on file  . Attends  Banker Meetings: Not on file  . Marital Status: Not on file  Intimate Partner Violence:   . Fear of Current or Ex-Partner: Not on file  . Emotionally Abused: Not on file  . Physically Abused: Not on file  . Sexually Abused: Not on file    Allergies:  No Known Allergies  Medications: Prior to Admission medications   Medication Sig Start Date End Date Taking? Authorizing Provider  escitalopram (LEXAPRO) 20 MG tablet Take 1 tablet (20 mg total) by mouth daily. Patient taking differently: Take 10 mg by mouth daily. Takes 0.5 tablet daily 04/23/18  Yes Vena Austria, MD  estradiol (ESTRACE) 1 MG tablet Take 1 tablet (1 mg total) by mouth daily. 06/25/19  Yes Vena Austria, MD  HYDROcodone-acetaminophen (HYCET) 7.5-325 mg/15 ml solution Take 15 mLs by mouth 4 (four) times daily as needed for moderate pain. 05/24/19 05/23/20  Linus Salmons, MD    Physical Exam Vitals: Blood pressure 120/80, pulse 97, height 5\' 5"  (1.651 m), weight 165 lb (74.8 kg).  General: NAD HEENT: normocephalic, anicteric Thyroid: no enlargement, no palpable nodules Pulmonary: No increased work of breathing, CTAB Cardiovascular: RRR, distal pulses 2+ Breast: Breast symmetrical, no tenderness, no palpable nodules or masses, no skin or nipple retraction present, no nipple discharge.  No axillary or supraclavicular lymphadenopathy. Abdomen: NABS, soft, non-tender, non-distended.  Umbilicus without lesions.  No hepatomegaly, splenomegaly or masses palpable. No evidence of hernia  Genitourinary:  External: Normal external female genitalia.  Normal urethral meatus, normal Bartholin's and Skene's glands.    Vagina: Normal vaginal mucosa, no evidence of prolapse.    Cervix: surgically absent  Uterus: surgically absent  Adnexa: ovaries non-enlarged, no adnexal masses  Rectal: deferred  Lymphatic: no evidence of inguinal lymphadenopathy Extremities: no edema, erythema, or tenderness Neurologic:  Grossly intact Psychiatric: mood appropriate, affect full  Female chaperone present for pelvic and breast  portions of the physical exam     Assessment: 49 y.o. 42 routine annual exam  Plan: Problem List Items Addressed This Visit    None    Visit Diagnoses    Encounter for gynecological examination without abnormal finding    -  Primary   Breast screening          1) Mammogram - recommend yearly screening mammogram.  Mammogram Is up to date  2) STI screening  was notoffered and therefore not obtained  3) ASCCP guidelines and rational discussed.  Patient opts for discontinue secondary to prior hysterectomy screening interval  4) Osteoporosis  - per USPTF routine screening DEXA at age 62  Consider FDA-approved medical therapies in postmenopausal women and men aged 66 years and older, based on the following: a) A hip or vertebral (clinical or morphometric) fracture b) T-score ? -2.5 at the femoral neck or spine after appropriate evaluation to exclude secondary causes C) Low bone mass (  T-score between -1.0 and -2.5 at the femoral neck or spine) and a 10-year probability of a hip fracture ? 3% or a 10-year probability of a major osteoporosis-related fracture ? 20% based on the [place]-adapted WHO algorithm   5) Routine healthcare maintenance including cholesterol, diabetes screening discussed managed by PCP  6) Colonoscopy 05/08/2019 Dr. Bonna Gains   7) Return in about 1 year (around 07/22/2020) for annual.    Malachy Mood, MD Mosetta Pigeon, [facility] 07/23/2019, 9:47 AM

## 2019-09-17 DIAGNOSIS — L918 Other hypertrophic disorders of the skin: Secondary | ICD-10-CM | POA: Diagnosis not present

## 2019-09-17 DIAGNOSIS — L309 Dermatitis, unspecified: Secondary | ICD-10-CM | POA: Diagnosis not present

## 2019-10-07 ENCOUNTER — Ambulatory Visit: Payer: Self-pay | Attending: Internal Medicine

## 2019-10-07 DIAGNOSIS — Z23 Encounter for immunization: Secondary | ICD-10-CM

## 2019-10-07 NOTE — Progress Notes (Signed)
   Covid-19 Vaccination Clinic  Name:  [name]    MRN: [id number] DOB: 1971/03/12  10/07/2019  [name] was observed post Covid-19 immunization for 15 minutes without incident. She was provided with Vaccine Information Sheet and instruction to access the V-Safe system.   [name] was instructed to call 911 with any severe reactions post vaccine: Marland Kitchen Difficulty breathing  . Swelling of face and throat  . A fast heartbeat  . A bad rash all over body  . Dizziness and weakness   Immunizations Administered    Name Date Dose VIS Date Route   Pfizer COVID-19 Vaccine 10/07/2019  2:07 PM 0.3 mL 06/21/2019 Intramuscular   Manufacturer: [employer], [employer]   Lot: [id number]   NDC: [address]-3

## 2019-10-17 ENCOUNTER — Other Ambulatory Visit: Payer: Self-pay

## 2019-10-17 ENCOUNTER — Encounter: Payer: Self-pay | Admitting: Emergency Medicine

## 2019-10-17 ENCOUNTER — Emergency Department: Payer: BC Managed Care – PPO

## 2019-10-17 ENCOUNTER — Inpatient Hospital Stay
Admission: EM | Admit: 2019-10-17 | Discharge: 2019-10-21 | DRG: 177 | Disposition: A | Discharge status: Home / Self Care | Payer: BC Managed Care – PPO | Attending: Internal Medicine | Admitting: Internal Medicine

## 2019-10-17 DIAGNOSIS — J1282 Pneumonia due to coronavirus disease 2019: Secondary | ICD-10-CM | POA: Diagnosis not present

## 2019-10-17 DIAGNOSIS — M4850XA Collapsed vertebra, not elsewhere classified, site unspecified, initial encounter for fracture: Secondary | ICD-10-CM | POA: Diagnosis not present

## 2019-10-17 DIAGNOSIS — J188 Other pneumonia, unspecified organism: Secondary | ICD-10-CM | POA: Diagnosis present

## 2019-10-17 DIAGNOSIS — Z79899 Other long term (current) drug therapy: Secondary | ICD-10-CM | POA: Diagnosis not present

## 2019-10-17 DIAGNOSIS — Z8249 Family history of ischemic heart disease and other diseases of the circulatory system: Secondary | ICD-10-CM

## 2019-10-17 DIAGNOSIS — J302 Other seasonal allergic rhinitis: Secondary | ICD-10-CM | POA: Diagnosis not present

## 2019-10-17 DIAGNOSIS — R918 Other nonspecific abnormal finding of lung field: Secondary | ICD-10-CM | POA: Diagnosis not present

## 2019-10-17 DIAGNOSIS — R0602 Shortness of breath: Secondary | ICD-10-CM | POA: Diagnosis not present

## 2019-10-17 DIAGNOSIS — J9801 Acute bronchospasm: Secondary | ICD-10-CM | POA: Diagnosis not present

## 2019-10-17 DIAGNOSIS — R0902 Hypoxemia: Secondary | ICD-10-CM | POA: Diagnosis not present

## 2019-10-17 DIAGNOSIS — M069 Rheumatoid arthritis, unspecified: Secondary | ICD-10-CM | POA: Diagnosis not present

## 2019-10-17 DIAGNOSIS — Z90710 Acquired absence of both cervix and uterus: Secondary | ICD-10-CM

## 2019-10-17 DIAGNOSIS — M40209 Unspecified kyphosis, site unspecified: Secondary | ICD-10-CM | POA: Diagnosis present

## 2019-10-17 DIAGNOSIS — U071 COVID-19: Secondary | ICD-10-CM | POA: Diagnosis not present

## 2019-10-17 DIAGNOSIS — Z79890 Hormone replacement therapy (postmenopausal): Secondary | ICD-10-CM

## 2019-10-17 LAB — CBC WITH DIFFERENTIAL/PLATELET
Abs Immature Granulocytes: 0.03 10*3/uL (ref 0.00–0.07)
Basophils Absolute: 0 10*3/uL (ref 0.0–0.1)
Basophils Relative: 0 %
Eosinophils Absolute: 0 10*3/uL (ref 0.0–0.5)
Eosinophils Relative: 0 %
HCT: 41.2 % (ref 36.0–46.0)
Hemoglobin: 14 g/dL (ref 12.0–15.0)
Immature Granulocytes: 1 %
Lymphocytes Relative: 32 %
Lymphs Abs: 1.5 10*3/uL (ref 0.7–4.0)
MCH: 32 pg (ref 26.0–34.0)
MCHC: 34 g/dL (ref 30.0–36.0)
MCV: 94.3 fL (ref 80.0–100.0)
Monocytes Absolute: 0.5 10*3/uL (ref 0.1–1.0)
Monocytes Relative: 10 %
Neutro Abs: 2.6 10*3/uL (ref 1.7–7.7)
Neutrophils Relative %: 57 %
Platelets: 212 10*3/uL (ref 150–400)
RBC: 4.37 MIL/uL (ref 3.87–5.11)
RDW: 12.3 % (ref 11.5–15.5)
WBC: 4.7 10*3/uL (ref 4.0–10.5)
nRBC: 0 % (ref 0.0–0.2)

## 2019-10-17 LAB — CREATININE, SERUM
Creatinine, Ser: 0.84 mg/dL (ref 0.44–1.00)
GFR calc Af Amer: >60 mL/min (ref >60)
GFR calc non Af Amer: >60 mL/min (ref >60)

## 2019-10-17 LAB — TROPONIN I (HIGH SENSITIVITY): Troponin I (High Sensitivity): 2 ng/L (ref <18)

## 2019-10-17 LAB — CBC
HCT: 38.9 % (ref 36.0–46.0)
Hemoglobin: 13.1 g/dL (ref 12.0–15.0)
MCH: 32.3 pg (ref 26.0–34.0)
MCHC: 33.7 g/dL (ref 30.0–36.0)
MCV: 95.8 fL (ref 80.0–100.0)
Platelets: 209 10*3/uL (ref 150–400)
RBC: 4.06 MIL/uL (ref 3.87–5.11)
RDW: 12.1 % (ref 11.5–15.5)
WBC: 4.2 10*3/uL (ref 4.0–10.5)
nRBC: 0 % (ref 0.0–0.2)

## 2019-10-17 LAB — FIBRINOGEN: Fibrinogen: 569 mg/dL — ABNORMAL HIGH (ref 210–475)

## 2019-10-17 LAB — BASIC METABOLIC PANEL
Anion gap: 7 (ref 5–15)
BUN: 15 mg/dL (ref 6–20)
CO2: 29 mmol/L (ref 22–32)
Calcium: 8.7 mg/dL — ABNORMAL LOW (ref 8.9–10.3)
Chloride: 102 mmol/L (ref 98–111)
Creatinine, Ser: 0.82 mg/dL (ref 0.44–1.00)
GFR calc Af Amer: >60 mL/min (ref >60)
GFR calc non Af Amer: >60 mL/min (ref >60)
Glucose, Bld: 87 mg/dL (ref 70–99)
Potassium: 4.5 mmol/L (ref 3.5–5.1)
Sodium: 138 mmol/L (ref 135–145)

## 2019-10-17 LAB — URINALYSIS, COMPLETE (UACMP) WITH MICROSCOPIC
Bilirubin Urine: NEGATIVE
Glucose, UA: NEGATIVE mg/dL
Hgb urine dipstick: NEGATIVE
Ketones, ur: 5 mg/dL — AB
Leukocytes,Ua: NEGATIVE
Nitrite: NEGATIVE
Protein, ur: NEGATIVE mg/dL
Specific Gravity, Urine: 1.026 (ref 1.005–1.030)
pH: 5 (ref 5.0–8.0)

## 2019-10-17 LAB — RESPIRATORY PANEL BY RT PCR (FLU A&B, COVID)
Influenza A by PCR: NEGATIVE
Influenza B by PCR: NEGATIVE
SARS Coronavirus 2 by RT PCR: POSITIVE — AB

## 2019-10-17 LAB — LACTATE DEHYDROGENASE: LDH: 194 U/L — ABNORMAL HIGH (ref 98–192)

## 2019-10-17 LAB — FIBRIN DERIVATIVES D-DIMER (ARMC ONLY): Fibrin derivatives D-dimer (ARMC): 2319.33 ng/mL (FEU) — ABNORMAL HIGH (ref 0.00–499.00)

## 2019-10-17 LAB — ABO/RH: ABO/RH(D): A POS

## 2019-10-17 LAB — PROCALCITONIN: Procalcitonin: <0.1 ng/mL

## 2019-10-17 LAB — LACTIC ACID, PLASMA: Lactic Acid, Venous: 0.9 mmol/L (ref 0.5–1.9)

## 2019-10-17 LAB — C-REACTIVE PROTEIN: CRP: 3 mg/dL — ABNORMAL HIGH (ref <1.0)

## 2019-10-17 MED ORDER — SODIUM CHLORIDE 0.9 % IV SOLN
200.0000 mg | Freq: Once | INTRAVENOUS | Status: AC
  Administered 2019-10-17: 20:00:00 200 mg via INTRAVENOUS
  Filled 2019-10-17: qty 40

## 2019-10-17 MED ORDER — SODIUM CHLORIDE 0.9% FLUSH: 3.0000 mL | INTRAVENOUS | Status: DC | PRN

## 2019-10-17 MED ORDER — SODIUM CHLORIDE 0.9% FLUSH
3.0000 mL | Freq: Two times a day (BID) | INTRAVENOUS | Status: DC
  Administered 2019-10-18 – 2019-10-20 (×4): 3 mL via INTRAVENOUS

## 2019-10-17 MED ORDER — GUAIFENESIN-DM 100-10 MG/5ML PO SYRP
10.0000 mL | ORAL_SOLUTION | ORAL | Status: DC | PRN
  Filled 2019-10-17: qty 10

## 2019-10-17 MED ORDER — SODIUM CHLORIDE 0.9 % IV SOLN
1.0000 g | Freq: Once | INTRAVENOUS | Status: AC
  Administered 2019-10-17: 1 g via INTRAVENOUS
  Filled 2019-10-17: qty 10

## 2019-10-17 MED ORDER — SODIUM CHLORIDE 0.9 % IV SOLN: 250.0000 mL | INTRAVENOUS | Status: DC | PRN

## 2019-10-17 MED ORDER — SODIUM CHLORIDE 0.9 % IV SOLN
1.0000 g | INTRAVENOUS | Status: DC
  Filled 2019-10-17: qty 10

## 2019-10-17 MED ORDER — POLYETHYLENE GLYCOL 3350 17 G PO PACK: 17.0000 g | PACK | Freq: Every day | ORAL | Status: DC | PRN

## 2019-10-17 MED ORDER — ALBUTEROL SULFATE HFA 108 (90 BASE) MCG/ACT IN AERS
2.0000 | INHALATION_SPRAY | RESPIRATORY_TRACT | Status: DC | PRN
  Filled 2019-10-17: qty 6.7

## 2019-10-17 MED ORDER — SODIUM CHLORIDE 0.9 % IV SOLN
100.0000 mg | Freq: Every day | INTRAVENOUS | Status: AC
  Administered 2019-10-18 – 2019-10-21 (×4): 100 mg via INTRAVENOUS
  Filled 2019-10-17 (×2): qty 20
  Filled 2019-10-17 (×2): qty 100

## 2019-10-17 MED ORDER — IPRATROPIUM-ALBUTEROL 0.5-2.5 (3) MG/3ML IN SOLN
3.0000 mL | Freq: Once | RESPIRATORY_TRACT | Status: AC
  Administered 2019-10-17: 14:00:00 3 mL via RESPIRATORY_TRACT
  Filled 2019-10-17: qty 3

## 2019-10-17 MED ORDER — IPRATROPIUM-ALBUTEROL 0.5-2.5 (3) MG/3ML IN SOLN
3.0000 mL | Freq: Once | RESPIRATORY_TRACT | Status: AC
  Administered 2019-10-17: 15:00:00 3 mL via RESPIRATORY_TRACT
  Filled 2019-10-17: qty 3

## 2019-10-17 MED ORDER — SODIUM CHLORIDE 0.9 % IV SOLN
500.0000 mg | INTRAVENOUS | Status: DC
  Filled 2019-10-17: qty 500

## 2019-10-17 MED ORDER — ENOXAPARIN SODIUM 40 MG/0.4ML ~~LOC~~ SOLN
40.0000 mg | Freq: Two times a day (BID) | SUBCUTANEOUS | Status: DC
  Administered 2019-10-17: 40 mg via SUBCUTANEOUS
  Filled 2019-10-17 (×2): qty 0.4

## 2019-10-17 MED ORDER — DEXAMETHASONE SODIUM PHOSPHATE 10 MG/ML IJ SOLN
6.0000 mg | INTRAMUSCULAR | Status: DC
  Administered 2019-10-17 – 2019-10-20 (×4): 6 mg via INTRAVENOUS
  Filled 2019-10-17 (×4): qty 1

## 2019-10-17 MED ORDER — HYDROCOD POLST-CPM POLST ER 10-8 MG/5ML PO SUER: 5.0000 mL | Freq: Two times a day (BID) | ORAL | Status: DC | PRN

## 2019-10-17 MED ORDER — AZITHROMYCIN 500 MG PO TABS
500.0000 mg | ORAL_TABLET | Freq: Once | ORAL | Status: AC
  Administered 2019-10-17: 16:00:00 500 mg via ORAL
  Filled 2019-10-17: qty 1

## 2019-10-17 MED ORDER — DEXAMETHASONE SODIUM PHOSPHATE 10 MG/ML IJ SOLN
10.0000 mg | Freq: Once | INTRAMUSCULAR | Status: AC
  Administered 2019-10-17: 14:00:00 10 mg via INTRAVENOUS
  Filled 2019-10-17: qty 1

## 2019-10-17 MED ORDER — ACETAMINOPHEN 325 MG PO TABS: 650.0000 mg | ORAL_TABLET | Freq: Four times a day (QID) | ORAL | Status: DC | PRN

## 2019-10-17 NOTE — ED Provider Notes (Signed)
[facility] Emergency Department Provider Note  ____________________________________________  Time seen: Approximately 1:58 PM  I have reviewed the triage vital signs and the nursing notes.   HISTORY  Chief Complaint Shortness of Breath    HPI Janice Strickland is a 49 y.o. female that presentsto the emergency department for evaluation of COVID-19 for 8 days.  Patient received her Covid vaccination on March 29.  She developed a fever on March 31 and has had a consistent fever throughout the last week.  Fever broke yesterday and she has not had a fever since yesterday morning.  She tested positive for COVID-19 3 days ago. Yesterday she developed a cough, shortness of breath, chest tightness.  She purchased a home O2 monitor and read that her oxygen was 85% overnight.  Patient states that she does not feel bad and thought that she was getting over COVID-19 but came to the emergency department based on her low oxygen level reading.  Patient does not smoke.  No history of asthma.   Past Medical History:  Diagnosis Date  . Motion sickness    back seat - cars  . RA (rheumatoid arthritis) (HCC)   . Trochanteric bursitis of left hip   . Vertigo    during seasonal allergy season    Patient Active Problem List   Diagnosis Date Noted  . Encounter for screening colonoscopy   . Climacteric 07/01/2015    Past Surgical History:  Procedure Laterality Date  . ABDOMINAL HYSTERECTOMY  2003  . bunion removal  06/28/2018   right foot  . BUNIONECTOMY    . COLONOSCOPY WITH PROPOFOL N/A 05/08/2019   Procedure: COLONOSCOPY WITH PROPOFOL;  Surgeon: Pasty Spillers, MD;  Location: [facility];  Service: Endoscopy;  Laterality: N/A;  . TONSILLECTOMY Bilateral 05/24/2019   Procedure: TONSILLECTOMY;  Surgeon: Linus Salmons, MD;  Location: [facility];  Service: ENT;  Laterality: Bilateral;  . TONSILLECTOMY      Prior to Admission medications   Medication  Sig Start Date End Date Taking? Authorizing Provider  escitalopram (LEXAPRO) 20 MG tablet Take 1 tablet (20 mg total) by mouth daily. Patient taking differently: Take 10 mg by mouth daily. Takes 0.5 tablet daily 04/23/18   Vena Austria, MD  estradiol (ESTRACE) 1 MG tablet Take 1 tablet (1 mg total) by mouth daily. 06/25/19   Vena Austria, MD  HYDROcodone-acetaminophen (HYCET) 7.5-325 mg/15 ml solution Take 15 mLs by mouth 4 (four) times daily as needed for moderate pain. 05/24/19 05/23/20  Linus Salmons, MD    Allergies Patient has no known allergies.  Family History  Problem Relation Age of Onset  . Hypertension Mother   . Hypothyroidism Mother   . Heart disease Mother   . Healthy Father   . Hypertension Brother   . Breast cancer Neg Hx     Social History Social History   Tobacco Use  . Smoking status: Never Smoker  . Smokeless tobacco: Never Used  Substance Use Topics  . Alcohol use: No    Alcohol/week: 0.0 standard drinks    Comment: may have a drink on Holidays  . Drug use: No     Review of Systems  Constitutional: Positive for fevers. Eyes: No visual changes. No discharge. ENT: Negative for congestion and rhinorrhea. Cardiovascular: Positive for chest tightness. Respiratory: Positive for cough and SOB. Gastrointestinal: No abdominal pain.  No nausea, no vomiting.  No diarrhea.  No constipation. Musculoskeletal: Negative for musculoskeletal pain. Skin: Negative for rash, abrasions, lacerations,  ecchymosis. Neurological: Negative for headaches. Psychiatric: No mood changes.   ____________________________________________   PHYSICAL EXAM:  VITAL SIGNS: ED Triage Vitals [10/17/19 1111]  Enc Vitals Group     BP (!) 107/54     Pulse Rate 85     Resp 18     Temp 98.4 F (36.9 C)     Temp Source Oral     SpO2 95 %     Weight      Height      Head Circumference      Peak Flow      Pain Score 4     Pain Loc      Pain Edu?      Excl. in GC?       Constitutional: Alert and oriented. Well appearing and in no acute distress. Eyes: Conjunctivae are normal. PERRL. EOMI. No discharge. Head: Atraumatic. ENT: No frontal and maxillary sinus tenderness.      Ears: Tympanic membranes pearly gray with good landmarks. No discharge.      Nose: Mild congestion/rhinnorhea.      Mouth/Throat: Mucous membranes are moist. Oropharynx non-erythematous. Tonsils not enlarged. No exudates. Uvula midline. Neck: No stridor.   Hematological/Lymphatic/Immunilogical: No cervical lymphadenopathy. Cardiovascular: Normal rate, regular rhythm.  Good peripheral circulation. Respiratory: Normal respiratory effort without tachypnea or retractions. Lungs CTAB. Good air entry to the bases with no decreased or absent breath sounds. Gastrointestinal: Bowel sounds 4 quadrants. Soft and nontender to palpation. No guarding or rigidity. No palpable masses. No distention. Musculoskeletal: Full range of motion to all extremities. No gross deformities appreciated. Neurologic:  Normal speech and language. No gross focal neurologic deficits are appreciated.  Skin:  Skin is warm, dry and intact. No rash noted. Psychiatric: Mood and affect are normal. Speech and behavior are normal. Patient exhibits appropriate insight and judgement.   ____________________________________________   LABS (all labs ordered are listed, but only abnormal results are displayed) ____________________________________________  EKG  SR ____________________________________________  RADIOLOGY Lexine Baton, personally viewed and evaluated these images (plain radiographs) as part of my medical decision making, as well as reviewing the written report by the radiologist.  DG Chest 2 View  Addendum Date: 10/17/2019   ADDENDUM REPORT: 10/17/2019 12:07 ADDENDUM: These results were called by telephone at the time of interpretation on 10/17/2019 at 12:07 pm to provider PHILLIP STAFFORD , who verbally  acknowledged these results. Electronically Signed   By: Donzetta Kohut M.D.   On: 10/17/2019 12:07   Result Date: 10/17/2019 CLINICAL DATA:  Shortness of breath, history of COVID infection EXAM: CHEST - 2 VIEW COMPARISON:  None FINDINGS: Cardiomediastinal contours and hilar structures are normal. Subtle nodular opacity in the left upper lobe projecting over the scapular border. No dense consolidation or evidence of pleural effusion. On the lateral view there is added density extending superiorly and posteriorly from the hilum. There is kyphosis at the T12 level. Loss of height at this level of the anterior vertebral body. IMPRESSION: 1. Subtle nodular opacity in the left upper lobe. Findings suspicious for infection. No dense consolidation. 2. Kyphosis at the T12 level. Loss of height at this level anteriorly is age indeterminate, approximately 50% left height anteriorly than adjacent vertebral elementsw. This could be related to congenital vertebral anomaly or trauma/compression fracture. Correlate with any pain in this area to determine whether further imaging may be warranted or with any prior imaging or history that may be helpful. Electronically Signed: By: Donzetta Kohut M.D. On: 10/17/2019 11:57  ____________________________________________    PROCEDURES  Procedure(s) performed:    Procedures    Medications  cefTRIAXone (ROCEPHIN) 1 g in sodium chloride 0.9 % 100 mL IVPB (0 g Intravenous Stopped 10/17/19 1422)  dexamethasone (DECADRON) injection 10 mg (10 mg Intravenous Given 10/17/19 1336)  ipratropium-albuterol (DUONEB) 0.5-2.5 (3) MG/3ML nebulizer solution 3 mL (3 mLs Nebulization Given 10/17/19 1346)  ipratropium-albuterol (DUONEB) 0.5-2.5 (3) MG/3ML nebulizer solution 3 mL (3 mLs Nebulization Given 10/17/19 1442)  azithromycin (ZITHROMAX) tablet 500 mg (500 mg Oral Given 10/17/19 1549)     ____________________________________________   INITIAL IMPRESSION / ASSESSMENT AND PLAN / ED  COURSE  Pertinent labs & imaging results that were available during my care of the patient were reviewed by me and considered in my medical decision making (see chart for details).  Review of the [place] CSRS was performed in accordance of the [place] prior to dispensing any controlled drugs.   Patient presented to emergency department for evaluation of COVID-19.  Covid test was +3 days ago.  Chest x-ray concerning for left upper lobe pneumonia.  Oxygen levels dropped into the upper 80s while in the emergency department.  Patient was placed on 2 L of oxygen.  CBC and CMP are largely unremarkable.  EKG shows normal sinus rhythm.  X-ray questions a compression fracture of T12.  Patient denies ever having any specific trauma or back pain.  Patient will be admitted to the hospitalist service for hypoxia due to COVID-19 and pneumonia.  Patient was given IV ceftriaxone, IV Decadron in the emergency department.  She was given 2 breathing treatments without improvement of her symptoms.  LDH, procalcitonin, CRP, D-dimer, troponin are pending at this time.  Report was given to Dr. Jamse Arn who is agreeable with admission.    Janice Strickland was evaluated in Emergency Department on 10/17/2019 for the symptoms described in the history of present illness. She was evaluated in the context of the global COVID-19 pandemic, which necessitated consideration that the patient might be at risk for infection with the SARS-CoV-2 virus that causes COVID-19. Institutional protocols and algorithms that pertain to the evaluation of patients at risk for COVID-19 are in a state of rapid change based on information released by regulatory bodies including the CDC and federal and state organizations. These policies and algorithms were followed during the patient's care in the ED.  ____________________________________________  FINAL CLINICAL IMPRESSION(S) / ED DIAGNOSES  Final diagnoses:  Pneumonia due to COVID-19 virus      NEW  MEDICATIONS STARTED DURING THIS VISIT:  ED Discharge Orders    None          This chart was dictated using voice recognition software/Dragon. Despite best efforts to proofread, errors can occur which can change the meaning. Any change was purely unintentional.    Laban Emperor, PA-C 10/17/19 [place]    Carrie Mew, MD 10/24/19 [phone]

## 2019-10-17 NOTE — ED Notes (Signed)
report called to [employer]

## 2019-10-17 NOTE — ED Triage Notes (Signed)
C/O sob and fever.  COVID positive, test resulted yesterday.  States had first COVID vaccine on 3/29, symptoms started 3/30.

## 2019-10-17 NOTE — ED Notes (Signed)
Moving pt to room 43

## 2019-10-17 NOTE — H&P (Signed)
History and Physical:    Janice Strickland   XIP:[id number] DOB: [birth year] DOA: 10/17/2019  Referring MD/provider: PA Enid Derry PCP: Trey Sailors, PA-C   Patient coming from: Home  Chief Complaint: Cough and shortness of breath after being diagnosed with Covid 19.  History of Present Illness:   Janice Strickland is an 49 y.o. female who was in her usual state of good health until 7 to 8 days ago when she developed onset of fever to 103 associated with nausea vomiting and a little bit of diarrhea.  Patient had fevers daily along with profound fatigue and body aches.  3 days ago patient went to the [facility] and was diagnosed positive for COVID-19.  Patient states her fevers broke yesterday and she thought she was getting better however has over the past 2 to 3 days developed increasing shortness of breath and DOE associated with cough every time she took a deep breath then.  She bought a pulse oximeter at home and found her O2 sats were going to the low 80s so came to the ED.  Patient admits to anorexia, nausea but no real vomiting.  She has had a couple of episodes of diarrhea but that resolved 3 days ago.  She has had cramping abdominal pain which is also improving.  Denies dysuria hematuria.  Has intermittent dizziness.  Denies confusion or headache.  ED Course:  The patient was noted to have O2 saturations in the upper 80s at room air however with exercise patient desaturated to the mid 80s.  Patient was noted to be quite short of breath when she tried to walk.  Chest x-ray reveals "subtle nodular opacity in the left upper lobe projecting over the scapular border suspicious for infection).  Patient was treated with IV ceftriaxone, IV Decadron and admitted for pneumonia most likely secondary to SARS-CoV-2.  ROS:   ROS   Review of Systems: General: Denies fever, chills, malaise,  Eyes: Denies recent change in vision, no discharge, redness, pain noted Endocrine: Denies heat/cold  intolerance, polyuria or weight loss. Respiratory: Denies cough, SOB at rest or hemoptysis Cardiovascular: Denies chest pain or palpitations GI: Denies nausea, vomiting, diarrhea or constipation GU: Denies dysuria, frequency or hematuria CNS: Denies HA, dizziness, confusion, new weakness or clumsiness. Blood/lymphatics: Denies easy bruising or bleeding Mood/affect: Denies anxiety/depression    Past Medical History:   Past Medical History:  Diagnosis Date  . Motion sickness    back seat - cars  . RA (rheumatoid arthritis) (HCC)   . Trochanteric bursitis of left hip   . Vertigo    during seasonal allergy season    Past Surgical History:   Past Surgical History:  Procedure Laterality Date  . ABDOMINAL HYSTERECTOMY  2003  . bunion removal  06/28/2018   right foot  . BUNIONECTOMY    . COLONOSCOPY WITH PROPOFOL N/A 05/08/2019   Procedure: COLONOSCOPY WITH PROPOFOL;  Surgeon: Pasty Spillers, MD;  Location: [facility] ENDOSCOPY;  Service: Endoscopy;  Laterality: N/A;  . TONSILLECTOMY Bilateral 05/24/2019   Procedure: TONSILLECTOMY;  Surgeon: Linus Salmons, MD;  Location: [facility];  Service: ENT;  Laterality: Bilateral;  . TONSILLECTOMY      Social History:   Social History   Socioeconomic History  . Marital status: Married    Spouse name: Not on file  . Number of children: Not on file  . Years of education: Not on file  . Highest education level: Not on file  Occupational History  .  Not on file  Tobacco Use  . Smoking status: Never Smoker  . Smokeless tobacco: Never Used  Substance and Sexual Activity  . Alcohol use: No    Alcohol/week: 0.0 standard drinks    Comment: may have a drink on Holidays  . Drug use: No  . Sexual activity: Yes    Birth control/protection: Surgical    Comment: Hysterectomy  Other Topics Concern  . Not on file  Social History Narrative  . Not on file   Social Determinants of Health   Financial Resource Strain:   .  Difficulty of Paying Living Expenses:   Food Insecurity:   . Worried About Programme researcher, broadcasting/film/video in the Last Year:   . Barista in the Last Year:   Transportation Needs:   . Freight forwarder (Medical):   Marland Kitchen Lack of Transportation (Non-Medical):   Physical Activity:   . Days of Exercise per Week:   . Minutes of Exercise per Session:   Stress:   . Feeling of Stress :   Social Connections:   . Frequency of Communication with Friends and Family:   . Frequency of Social Gatherings with Friends and Family:   . Attends Religious Services:   . Active Member of Clubs or Organizations:   . Attends Banker Meetings:   Marland Kitchen Marital Status:   Intimate Partner Violence:   . Fear of Current or Ex-Partner:   . Emotionally Abused:   Marland Kitchen Physically Abused:   . Sexually Abused:     Allergies   Patient has no known allergies.  Family history:   Family History  Problem Relation Age of Onset  . Hypertension Mother   . Hypothyroidism Mother   . Heart disease Mother   . Healthy Father   . Hypertension Brother   . Breast cancer Neg Hx     Current Medications:   Prior to Admission medications   Medication Sig Start Date End Date Taking? Authorizing Provider  estradiol (ESTRACE) 1 MG tablet Take 1 tablet (1 mg total) by mouth daily. 06/25/19  Yes Vena Austria, MD    Physical Exam:   Vitals:   10/17/19 1241 10/17/19 1330 10/17/19 1459 10/17/19 1552  BP:  111/63  118/67  Pulse: 77 68  70  Resp:  18  18  Temp:      TempSrc:      SpO2: 94% 93% 95% 96%     Physical Exam: Blood pressure 118/67, pulse 70, temperature 98.4 F (36.9 C), temperature source Oral, resp. rate 18, SpO2 96 %. Gen: Tired appearing female sitting up in bed with mild tachypnea, no accessory muscle use. Eyes: sclera anicteric, conjuctiva mildly injected bilaterally CVS: S1-S2, regulary, no gallops Respiratory: Reasonable air entry bilaterally however patient unable to take deep breath due  to bronchospastic cough midway through breath.  I do not hear any wheezes or rhonchi but she was unable to take good breaths. GI: NABS, soft, NT  LE: No edema. No cyanosis Neuro: A/O x 3, Moving all extremities equally with normal strength, CN 3-12 intact, grossly nonfocal.  Psych: patient is logical and coherent, judgement and insight appear normal, mood and affect appropriate to situation. Skin: no rashes or lesions or ulcers,    Data Review:    Labs: Basic Metabolic Panel: Recent Labs  Lab 10/17/19 1330  NA 138  K 4.5  CL 102  CO2 29  GLUCOSE 87  BUN 15  CREATININE 0.82  CALCIUM 8.7*  Liver Function Tests: No results for input(s): AST, ALT, ALKPHOS, BILITOT, PROT, ALBUMIN in the last 168 hours. No results for input(s): LIPASE, AMYLASE in the last 168 hours. No results for input(s): AMMONIA in the last 168 hours. CBC: Recent Labs  Lab 10/17/19 1242  WBC 4.7  NEUTROABS 2.6  HGB 14.0  HCT 41.2  MCV 94.3  PLT 212   Cardiac Enzymes: No results for input(s): CKTOTAL, CKMB, CKMBINDEX, TROPONINI in the last 168 hours.  BNP (last 3 results) No results for input(s): PROBNP in the last 8760 hours. CBG: No results for input(s): GLUCAP in the last 168 hours.  Urinalysis    Component Value Date/Time   COLORURINE AMBER (A) 10/17/2019 1419   APPEARANCEUR HAZY (A) 10/17/2019 1419   LABSPEC 1.026 10/17/2019 1419   PHURINE 5.0 10/17/2019 1419   GLUCOSEU NEGATIVE 10/17/2019 1419   HGBUR NEGATIVE 10/17/2019 1419   BILIRUBINUR NEGATIVE 10/17/2019 1419   KETONESUR 5 (A) 10/17/2019 1419   PROTEINUR NEGATIVE 10/17/2019 1419   NITRITE NEGATIVE 10/17/2019 1419   LEUKOCYTESUR NEGATIVE 10/17/2019 1419      Radiographic Studies: DG Chest 2 View  Addendum Date: 10/17/2019   ADDENDUM REPORT: 10/17/2019 12:07 ADDENDUM: These results were called by telephone at the time of interpretation on 10/17/2019 at 12:07 pm to provider PHILLIP STAFFORD , who verbally acknowledged these  results. Electronically Signed   By: Zetta Bills M.D.   On: 10/17/2019 12:07   Result Date: 10/17/2019 CLINICAL DATA:  Shortness of breath, history of COVID infection EXAM: CHEST - 2 VIEW COMPARISON:  None FINDINGS: Cardiomediastinal contours and hilar structures are normal. Subtle nodular opacity in the left upper lobe projecting over the scapular border. No dense consolidation or evidence of pleural effusion. On the lateral view there is added density extending superiorly and posteriorly from the hilum. There is kyphosis at the T12 level. Loss of height at this level of the anterior vertebral body. IMPRESSION: 1. Subtle nodular opacity in the left upper lobe. Findings suspicious for infection. No dense consolidation. 2. Kyphosis at the T12 level. Loss of height at this level anteriorly is age indeterminate, approximately 50% left height anteriorly than adjacent vertebral elementsw. This could be related to congenital vertebral anomaly or trauma/compression fracture. Correlate with any pain in this area to determine whether further imaging may be warranted or with any prior imaging or history that may be helpful. Electronically Signed: By: Zetta Bills M.D. On: 10/17/2019 11:57    EKG: Independently reviewed.  Sinus rhythm at 90.  Normal intervals.  Normal axis.  Nonspecific ST-T wave changes.   Assessment/Plan:   Principal Problem:   Pneumonia due to COVID-19 virus Active Problems:   RA (rheumatoid arthritis) ([facility])  49 year old otherwise healthy female is admitted with recent diagnosis of COVID-19 and new shortness of breath and hypoxia.  Chest x-ray shows a subtle nodular opacity that is asymmetrical, not typical of Covid pneumonia.  However her history and bronchospastic cough is very suggestive of COVID-19 pneumonia.  CAP probably secondary to COVID-19 I do suspect patient has COVID-19 pneumonia although the asymmetrical nature of her chest x-ray is perhaps more suggestive of bacterial  [employer] pneumonia. Procalcitonin is still pending.  CRP is pending. Continue ceftriaxone and add azithromycin day #1. Request remdesivir per pharmacy consult.  Since there is no Covid test in the system, a stat Covid has been ordered. Patient got steroids in the ED, will continue dexamethasone 6 mg day # 1/10 Patient has markedly elevated FDP/D-dimer and is  on estrogen per home med rec, will place patient on intermediate dose Lovenox 40 mg twice daily. Oxygen to keep O2 sats greater than 92%. Combivent inhaler for clear bronchospasm. Suppressant as needed.  Possible osteoporosis Patient noted to have some kyphosis and possible compression fracture on x-ray. These can be worked up as an outpatient. Hold estrogen    Other information:   DVT prophylaxis: Intermediate dose Lovenox ordered. Code Status: Full Family Communication: Patient states her family knows she is here Disposition Plan: Home Consults called: None Admission status: Inpatient  Benno Brensinger Tublu Myrtle Barnhard [facility]  If 7PM-7AM, please contact night-coverage [email or web address] Password [facility] 10/17/2019, 5:36 PM

## 2019-10-17 NOTE — ED Notes (Signed)
Pt's O2 sats fluctuating from 95% down to 89%, placed pt on 2L [place].

## 2019-10-17 NOTE — ED Notes (Signed)
Observed pt ambulating back and forth in room and pt's O2 sats stayed between 90-95%.

## 2019-10-17 NOTE — Consult Note (Signed)
Remdesivir - Pharmacy Brief Note   49 y.o. female who  7 to 8 days ago developed onset of fever to 103 associated with nausea vomiting and a little bit of diarrhea.  3 days ago patient went to the [facility] and was diagnosed positive for COVID-19.    A/P:  Remdesivir 200 mg IVPB once followed by 100 mg IVPB daily x 4 days.   Burnis Medin, PharmD 10/17/2019 5:42 PM

## 2019-10-17 NOTE — ED Notes (Signed)
Pt reported itching on her anterior neck. She states itching is normal for her but usually on her back. Pt denies any difficulty breathing, swelling of lips, tongue or throat. Skin tone appears normal for ethnicity.

## 2019-10-17 NOTE — ED Notes (Addendum)
ED TO INPATIENT HANDOFF REPORT  ED Nurse Name and Phone #:   Elijah Birk RN  [phone]  S Name/Age/Gender Janice Strickland 49 y.o. female Room/Bed: ED43A/ED43A  Code Status   Code Status: Full Code  Home/SNF/Other Home Patient oriented to: self, place, time and situation Is this baseline? Yes   Triage Complete: Triage complete  Chief Complaint Pneumonia due to COVID-19 virus [U07.1, J12.82]  Triage Note C/O sob and fever.  COVID positive, test resulted yesterday.  States had first COVID vaccine on 3/29, symptoms started 3/30.    Allergies No Known Allergies  Level of Care/Admitting Diagnosis ED Disposition    ED Disposition Condition Comment   Admit  Hospital Area: [facility] [[id number]]  Level of Care: Med-Surg [16]  Covid Evaluation: Confirmed COVID Positive  Diagnosis: Pneumonia due to COVID-19 virus [[id number]]  Admitting Physician: Pieter Partridge [[id number]]  Attending Physician: Pieter Partridge [[id number]]  Estimated length of stay: past midnight tomorrow  Certification:: I certify this patient will need inpatient services for at least 2 midnights       B Medical/Surgery History Past Medical History:  Diagnosis Date  . Motion sickness    back seat - cars  . RA (rheumatoid arthritis) (HCC)   . Trochanteric bursitis of left hip   . Vertigo    during seasonal allergy season   Past Surgical History:  Procedure Laterality Date  . ABDOMINAL HYSTERECTOMY  2003  . bunion removal  06/28/2018   right foot  . BUNIONECTOMY    . COLONOSCOPY WITH PROPOFOL N/A 05/08/2019   Procedure: COLONOSCOPY WITH PROPOFOL;  Surgeon: Pasty Spillers, MD;  Location: [facility] END[facility];  Service: Endoscopy;  Laterality: N/A;  . TONSILLECTOMY Bilateral 05/24/2019   Procedure: TONSILLECTOMY;  Surgeon: Linus Salmons, MD;  Location: [facility];  Service: ENT;  Laterality: Bilateral;  . TONSILLECTOMY       A IV  Location/Drains/Wounds Patient Lines/Drains/Airways Status   Active Line/Drains/Airways    Name:   Placement date:   Placement time:   Site:   Days:   Peripheral IV 10/17/19 Left Antecubital   10/17/19    1251    Antecubital   less than 1   Incision (Closed) 05/24/19 Throat   05/24/19    0842     146          Intake/Output Last 24 hours  Intake/Output Summary (Last 24 hours) at 10/17/2019 2038 Last data filed at 10/17/2019 1422 Gross per 24 hour  Intake 100 ml  Output --  Net 100 ml    Labs/Imaging Results for orders placed or performed during the hospital encounter of 10/17/19 (from the past 48 hour(s))  CBC with Differential     Status: None   Collection Time: 10/17/19 12:42 PM  Result Value Ref Range   WBC 4.7 4.0 - 10.5 K/uL   RBC 4.37 3.87 - 5.11 MIL/uL   Hemoglobin 14.0 12.0 - 15.0 g/dL   HCT 32.4 40.1 - 02.7 %   MCV 94.3 80.0 - 100.0 fL   MCH 32.0 26.0 - 34.0 pg   MCHC 34.0 30.0 - 36.0 g/dL   RDW 25.3 66.4 - 40.3 %   Platelets 212 150 - 400 K/uL   nRBC 0.0 0.0 - 0.2 %   Neutrophils Relative % 57 %   Neutro Abs 2.6 1.7 - 7.7 K/uL   Lymphocytes Relative 32 %   Lymphs Abs 1.5 0.7 - 4.0 K/uL   Monocytes Relative 10 %  Monocytes Absolute 0.5 0.1 - 1.0 K/uL   Eosinophils Relative 0 %   Eosinophils Absolute 0.0 0.0 - 0.5 K/uL   Basophils Relative 0 %   Basophils Absolute 0.0 0.0 - 0.1 K/uL   Immature Granulocytes 1 %   Abs Immature Granulocytes 0.03 0.00 - 0.07 K/uL    Comment: Performed at [facility], [address] [place], [place] [address]  Basic metabolic panel     Status: Abnormal   Collection Time: 10/17/19  1:30 PM  Result Value Ref Range   Sodium 138 135 - 145 mmol/L   Potassium 4.5 3.5 - 5.1 mmol/L   Chloride 102 98 - 111 mmol/L   CO2 29 22 - 32 mmol/L   Glucose, Bld 87 70 - 99 mg/dL    Comment: Glucose reference range applies only to samples taken after fasting for at least 8 hours.   BUN 15 6 - 20 mg/dL   Creatinine, Ser 7.82 0.44 -  1.00 mg/dL   Calcium 8.7 (L) 8.9 - 10.3 mg/dL   GFR calc non Af Amer >60 >60 mL/min   GFR calc Af Amer >60 >60 mL/min   Anion gap 7 5 - 15    Comment: Performed at [facility], [address] [place], [place] [address]  Lactic acid, plasma     Status: None   Collection Time: 10/17/19  2:14 PM  Result Value Ref Range   Lactic Acid, Venous 0.9 0.5 - 1.9 mmol/L    Comment: Performed at [facility], [address] [place], [place] [address]  C-reactive protein     Status: Abnormal   Collection Time: 10/17/19  2:16 PM  Result Value Ref Range   CRP 3.0 (H) <1.0 mg/dL    Comment: Performed at [facility], [address], [place], [place] [address]  Urinalysis, Complete w Microscopic     Status: Abnormal   Collection Time: 10/17/19  2:19 PM  Result Value Ref Range   Color, Urine AMBER (A) YELLOW    Comment: BIOCHEMICALS MAY BE AFFECTED BY COLOR   APPearance HAZY (A) CLEAR   Specific Gravity, Urine 1.026 1.005 - 1.030   pH 5.0 5.0 - 8.0   Glucose, UA NEGATIVE NEGATIVE mg/dL   Hgb urine dipstick NEGATIVE NEGATIVE   Bilirubin Urine NEGATIVE NEGATIVE   Ketones, ur 5 (A) NEGATIVE mg/dL   Protein, ur NEGATIVE NEGATIVE mg/dL   Nitrite NEGATIVE NEGATIVE   Leukocytes,Ua NEGATIVE NEGATIVE   RBC / HPF 0-5 0 - 5 RBC/hpf   WBC, UA 0-5 0 - 5 WBC/hpf   Bacteria, UA FEW (A) NONE SEEN   Squamous Epithelial / LPF 11-20 0 - 5   Mucus PRESENT     Comment: Performed at [facility], [address] [place], [place] [address]  Fibrin derivatives D-Dimer [facility] only)     Status: Abnormal   Collection Time: 10/17/19  3:14 PM  Result Value Ref Range   Fibrin derivatives D-dimer ([facility]) 2,319.33 (H) 0.00 - 499.00 ng/mL (FEU)    Comment: (NOTE) <> Exclusion of Venous Thromboembolism (VTE) - OUTPATIENT ONLY   (Emergency Department or [place])   0-499 ng/ml (FEU): With a low to intermediate pretest probability                      for VTE this test result excludes the  diagnosis  of VTE.   >499 ng/ml (FEU) : VTE not excluded; additional work up for VTE is                      required. <> Testing on Inpatients and Evaluation of Disseminated Intravascular   Coagulation (DIC) Reference Range:   0-499 ng/ml (FEU) Performed at [facility], [address] [place], [place] [address]   Fibrinogen     Status: Abnormal   Collection Time: 10/17/19  3:14 PM  Result Value Ref Range   Fibrinogen 569 (H) 210 - 475 mg/dL    Comment: Performed at [facility], [address] , [place] [address]  Lactate dehydrogenase     Status: Abnormal   Collection Time: 10/17/19  3:14 PM  Result Value Ref Range   LDH 194 (H) 98 - 192 U/L    Comment: Performed at [facility], [place], [place] [address]  Troponin I (High Sensitivity)     Status: None   Collection Time: 10/17/19  3:14 PM  Result Value Ref Range   Troponin I (High Sensitivity) 2 <18 ng/L    Comment: (NOTE) Elevated high sensitivity troponin I (hsTnI) values and significant  changes across serial measurements may suggest ACS but many other  chronic and acute conditions are known to elevate hsTnI results.  Refer to the "Links" section for chest pain algorithms and additional  guidance. Performed at [facility], [address] [place], [place] [address]   Procalcitonin - Baseline     Status: None   Collection Time: 10/17/19  3:14 PM  Result Value Ref Range   Procalcitonin <0.10 ng/mL    Comment:        Interpretation: PCT (Procalcitonin) <= 0.5 ng/mL: Systemic infection (sepsis) is not likely. Local bacterial infection is possible. (NOTE)       Sepsis PCT Algorithm           Lower Respiratory Tract                                      Infection PCT Algorithm    ----------------------------     ----------------------------         PCT < 0.25 ng/mL                PCT < 0.10 ng/mL         Strongly encourage              Strongly discourage   discontinuation of antibiotics    initiation of antibiotics    ----------------------------     -----------------------------       PCT 0.25 - 0.50 ng/mL            PCT 0.10 - 0.25 ng/mL               OR       >80% decrease in PCT            Discourage initiation of                                            antibiotics      Encourage discontinuation           of antibiotics    ----------------------------     -----------------------------  PCT >= 0.50 ng/mL              PCT 0.26 - 0.50 ng/mL               AND        <80% decrease in PCT             Encourage initiation of                                             antibiotics       Encourage continuation           of antibiotics    ----------------------------     -----------------------------        PCT >= 0.50 ng/mL                  PCT > 0.50 ng/mL               AND         increase in PCT                  Strongly encourage                                      initiation of antibiotics    Strongly encourage escalation           of antibiotics                                     -----------------------------                                           PCT <= 0.25 ng/mL                                                 OR                                        > 80% decrease in PCT                                     Discontinue / Do not initiate                                             antibiotics Performed at [facility], [address] [place], [place] [address]   Respiratory Panel by RT PCR (Flu A&B, Covid) - Nasopharyngeal Swab     Status: Abnormal   Collection Time: 10/17/19  5:37 PM   Specimen: Nasopharyngeal Swab  Result Value Ref Range   SARS Coronavirus 2 by RT PCR POSITIVE (A) NEGATIVE    Comment: RESULT CALLED  TO, READ BACK BY AND VERIFIED WITH: CAROLLYN HUTCHINSON  10/17/19 MJU (NOTE) SARS-CoV-2 target nucleic acids are DETECTED. SARS-CoV-2 RNA is generally  detectable in upper respiratory specimens  during the acute phase of infection. Positive results are indicative of the presence of the identified virus, but do not rule out bacterial infection or co-infection with other pathogens not detected by the test. Clinical correlation with patient history and other diagnostic information is necessary to determine patient infection status. The expected result is Negative. Fact Sheet for Patients:  https://www.moore.com/ Fact Sheet for Healthcare Providers: https://www.young.biz/ This test is not yet approved or cleared by the [place] FDA and  has been authorized for detection and/or diagnosis of SARS-CoV-2 by FDA under an Emergency Use Authorization (EUA).  This EUA will remain in effect (meaning this test can be used)  for the duration of  the COVID-19 declaration under Section 564(b)(1) of the Act, 21 U.S.C. section 360bbb-3(b)(1), unless the authorization is terminated or revoked sooner.    Influenza A by PCR NEGATIVE NEGATIVE   Influenza B by PCR NEGATIVE NEGATIVE    Comment: (NOTE) The Xpert Xpress SARS-CoV-2/FLU/RSV assay is intended as an aid in  the diagnosis of influenza from Nasopharyngeal swab specimens and  should not be used as a sole basis for treatment. Nasal washings and  aspirates are unacceptable for Xpert Xpress SARS-CoV-2/FLU/RSV  testing. Fact Sheet for Patients: https://www.moore.com/ Fact Sheet for Healthcare Providers: https://www.young.biz/ This test is not yet approved or cleared by the [place] FDA and  has been authorized for detection and/or diagnosis of SARS-CoV-2 by  FDA under an Emergency Use Authorization (EUA). This EUA will remain  in effect (meaning this test can be used) for the duration of the  Covid-19 declaration under Section 564(b)(1) of the Act, 21  U.S.C. section 360bbb-3(b)(1), unless the authorization is   terminated or revoked. Performed at [facility], [address] [place], [place] [address]   ABO/Rh     Status: None   Collection Time: 10/17/19  7:35 PM  Result Value Ref Range   ABO/RH(D)      A POS Performed at [facility], [address] [place], [place] [address]   CBC     Status: None   Collection Time: 10/17/19  7:35 PM  Result Value Ref Range   WBC 4.2 4.0 - 10.5 K/uL   RBC 4.06 3.87 - 5.11 MIL/uL   Hemoglobin 13.1 12.0 - 15.0 g/dL   HCT 38.7 56.4 - 33.2 %   MCV 95.8 80.0 - 100.0 fL   MCH 32.3 26.0 - 34.0 pg   MCHC 33.7 30.0 - 36.0 g/dL   RDW 95.1 88.4 - 16.6 %   Platelets 209 150 - 400 K/uL   nRBC 0.0 0.0 - 0.2 %    Comment: Performed at [facility], [address] [place], [place] [address]  Creatinine, serum     Status: None   Collection Time: 10/17/19  7:35 PM  Result Value Ref Range   Creatinine, Ser 0.84 0.44 - 1.00 mg/dL   GFR calc non Af Amer >60 >60 mL/min   GFR calc Af Amer >60 >60 mL/min    Comment: Performed at [facility], [address], [place], [place] [address]   DG Chest 2 View  Addendum Date: 10/17/2019   ADDENDUM REPORT: 10/17/2019 12:07 ADDENDUM: These results were called by telephone at the time of interpretation on 10/17/2019 at 12:07 pm to provider PHILLIP STAFFORD , who  verbally acknowledged these results. Electronically Signed   By: Donzetta Kohut M.D.   On: 10/17/2019 12:07   Result Date: 10/17/2019 CLINICAL DATA:  Shortness of breath, history of COVID infection EXAM: CHEST - 2 VIEW COMPARISON:  None FINDINGS: Cardiomediastinal contours and hilar structures are normal. Subtle nodular opacity in the left upper lobe projecting over the scapular border. No dense consolidation or evidence of pleural effusion. On the lateral view there is added density extending superiorly and posteriorly from the hilum. There is kyphosis at the T12 level. Loss of height at this level of the anterior vertebral body.  IMPRESSION: 1. Subtle nodular opacity in the left upper lobe. Findings suspicious for infection. No dense consolidation. 2. Kyphosis at the T12 level. Loss of height at this level anteriorly is age indeterminate, approximately 50% left height anteriorly than adjacent vertebral elementsw. This could be related to congenital vertebral anomaly or trauma/compression fracture. Correlate with any pain in this area to determine whether further imaging may be warranted or with any prior imaging or history that may be helpful. Electronically Signed: By: Donzetta Kohut M.D. On: 10/17/2019 11:57    Pending Labs Unresulted Labs (From admission, onward)    Start     Ordered   10/24/19 0500  Creatinine, serum  (enoxaparin (LOVENOX)    CrCl >/= 30 ml/min)  Weekly,   STAT    Comments: while on enoxaparin therapy    10/17/19 1735   10/18/19 0500  Procalcitonin  Daily,   STAT     10/17/19 1546   10/18/19 0500  CBC with Differential/Platelet  Daily,   STAT     10/17/19 1735   10/18/19 0500  Comprehensive metabolic panel  Daily,   STAT     10/17/19 1735   10/18/19 0500  C-reactive protein  Daily,   STAT     10/17/19 1735   10/18/19 0500  Fibrin derivatives D-Dimer ([facility] only)  Daily,   STAT     10/17/19 1735          Vitals/Pain Today's Vitals   10/17/19 1459 10/17/19 1552 10/17/19 1717 10/17/19 2030  BP:  118/67  110/76  Pulse:  70  74  Resp:  18  (!) 21  Temp:      TempSrc:      SpO2: 95% 96%  94%  PainSc:  0-No pain 0-No pain     Isolation Precautions Airborne and Contact precautions  Medications Medications  enoxaparin (LOVENOX) injection 40 mg (has no administration in time range)  sodium chloride flush (NS) 0.9 % injection 3 mL (has no administration in time range)  sodium chloride flush (NS) 0.9 % injection 3 mL (has no administration in time range)  0.9 %  sodium chloride infusion (has no administration in time range)  remdesivir 200 mg in sodium chloride 0.9% 250 mL IVPB (200 mg  Intravenous New Bag/Given 10/17/19 1945)    Followed by  remdesivir 100 mg in sodium chloride 0.9 % 100 mL IVPB (has no administration in time range)  dexamethasone (DECADRON) injection 6 mg (has no administration in time range)  albuterol (VENTOLIN HFA) 108 (90 Base) MCG/ACT inhaler 2 puff (has no administration in time range)  acetaminophen (TYLENOL) tablet 650 mg (has no administration in time range)  guaiFENesin-dextromethorphan (ROBITUSSIN DM) 100-10 MG/5ML syrup 10 mL (has no administration in time range)  chlorpheniramine-HYDROcodone (TUSSIONEX) 10-8 MG/5ML suspension 5 mL (has no administration in time range)  polyethylene glycol (MIRALAX / GLYCOLAX) packet 17 g (has no  administration in time range)  cefTRIAXone (ROCEPHIN) 1 g in sodium chloride 0.9 % 100 mL IVPB (has no administration in time range)  azithromycin (ZITHROMAX) 500 mg in sodium chloride 0.9 % 250 mL IVPB (has no administration in time range)  cefTRIAXone (ROCEPHIN) 1 g in sodium chloride 0.9 % 100 mL IVPB (0 g Intravenous Stopped 10/17/19 1422)  dexamethasone (DECADRON) injection 10 mg (10 mg Intravenous Given 10/17/19 1336)  ipratropium-albuterol (DUONEB) 0.5-2.5 (3) MG/3ML nebulizer solution 3 mL (3 mLs Nebulization Given 10/17/19 1346)  ipratropium-albuterol (DUONEB) 0.5-2.5 (3) MG/3ML nebulizer solution 3 mL (3 mLs Nebulization Given 10/17/19 1442)  azithromycin (ZITHROMAX) tablet 500 mg (500 mg Oral Given 10/17/19 1549)    Mobility walks Low fall risk   Focused Assessments Cardiac Assessment Handoff:    No results found for: CKTOTAL, CKMB, CKMBINDEX, TROPONINI No results found for: DDIMER Does the Patient currently have chest pain? No      R Recommendations: See Admitting Provider Note  Report given to: Lori RN on 1C  Additional Notes:

## 2019-10-18 LAB — COMPREHENSIVE METABOLIC PANEL
ALT: 29 U/L (ref 0–44)
AST: 30 U/L (ref 15–41)
Albumin: 3.4 g/dL — ABNORMAL LOW (ref 3.5–5.0)
Alkaline Phosphatase: 110 U/L (ref 38–126)
Anion gap: 10 (ref 5–15)
BUN: 15 mg/dL (ref 6–20)
CO2: 23 mmol/L (ref 22–32)
Calcium: 8.6 mg/dL — ABNORMAL LOW (ref 8.9–10.3)
Chloride: 104 mmol/L (ref 98–111)
Creatinine, Ser: 0.53 mg/dL (ref 0.44–1.00)
GFR calc Af Amer: >60 mL/min (ref >60)
GFR calc non Af Amer: >60 mL/min (ref >60)
Glucose, Bld: 132 mg/dL — ABNORMAL HIGH (ref 70–99)
Potassium: 3.9 mmol/L (ref 3.5–5.1)
Sodium: 137 mmol/L (ref 135–145)
Total Bilirubin: 0.8 mg/dL (ref 0.3–1.2)
Total Protein: 7 g/dL (ref 6.5–8.1)

## 2019-10-18 LAB — CBC WITH DIFFERENTIAL/PLATELET
Abs Immature Granulocytes: 0.02 10*3/uL (ref 0.00–0.07)
Basophils Absolute: 0 10*3/uL (ref 0.0–0.1)
Basophils Relative: 0 %
Eosinophils Absolute: 0 10*3/uL (ref 0.0–0.5)
Eosinophils Relative: 0 %
HCT: 40 % (ref 36.0–46.0)
Hemoglobin: 13.3 g/dL (ref 12.0–15.0)
Immature Granulocytes: 1 %
Lymphocytes Relative: 32 %
Lymphs Abs: 1 10*3/uL (ref 0.7–4.0)
MCH: 31.1 pg (ref 26.0–34.0)
MCHC: 33.3 g/dL (ref 30.0–36.0)
MCV: 93.7 fL (ref 80.0–100.0)
Monocytes Absolute: 0.1 10*3/uL (ref 0.1–1.0)
Monocytes Relative: 3 %
Neutro Abs: 1.9 10*3/uL (ref 1.7–7.7)
Neutrophils Relative %: 64 %
Platelets: 235 10*3/uL (ref 150–400)
RBC: 4.27 MIL/uL (ref 3.87–5.11)
RDW: 12 % (ref 11.5–15.5)
Smear Review: NORMAL
WBC Morphology: REACTIVE
WBC: 2.9 10*3/uL — ABNORMAL LOW (ref 4.0–10.5)
nRBC: 0 % (ref 0.0–0.2)

## 2019-10-18 LAB — C-REACTIVE PROTEIN: CRP: 2.8 mg/dL — ABNORMAL HIGH (ref <1.0)

## 2019-10-18 LAB — FIBRIN DERIVATIVES D-DIMER (ARMC ONLY): Fibrin derivatives D-dimer (ARMC): 1952.39 ng/mL (FEU) — ABNORMAL HIGH (ref 0.00–499.00)

## 2019-10-18 LAB — PROCALCITONIN: Procalcitonin: <0.1 ng/mL

## 2019-10-18 MED ORDER — ENOXAPARIN SODIUM 40 MG/0.4ML ~~LOC~~ SOLN
40.0000 mg | SUBCUTANEOUS | Status: DC
  Administered 2019-10-18 – 2019-10-20 (×3): 40 mg via SUBCUTANEOUS
  Filled 2019-10-18 (×3): qty 0.4

## 2019-10-18 MED ORDER — ONDANSETRON HCL 4 MG/2ML IJ SOLN
4.0000 mg | Freq: Four times a day (QID) | INTRAMUSCULAR | Status: DC | PRN
  Administered 2019-10-18: 4 mg via INTRAVENOUS
  Filled 2019-10-18: qty 2

## 2019-10-18 NOTE — Progress Notes (Signed)
PROGRESS NOTE    Janice Strickland  VQQ:[id number]  DOB: June 13, 1971  DOA: 10/17/2019 PCP: Janice Strickland Outpatient Specialists:   Hospital course:  49 year old otherwise healthy female developed fevers to 103, malaise, myalgias nausea and vomiting on 10/10/2019.  She was diagnosed with COVID-19 on 10/15/2019 at the [facility].  Fevers improved however patient subsequently developed increasing shortness of breath and DOE.  Chest x-ray showed subtle nodular opacity in the left upper lobe.  She was admitted on 10/17/2019 for likely Covid pneumonia.  O2 sats dropped to high 80s with ambulation.   Subjective:  Patient states she does feel better than yesterday.  Notes her appetite is a little bit improved and she feels somewhat less tired.  However she notes continued shortness of breath if she takes the oxygen off and feels very winded just walking to the bathroom which is a short distance away.  Patient also notes cough with deep inspiration and sometimes with just talking.  Objective: Vitals:   10/17/19 2146 10/17/19 2200 10/17/19 2335 10/18/19 0915  BP: 108/63  105/60   Pulse: 64  (!) 56   Resp: 14  20   Temp: 97.7 F (36.5 C)     TempSrc: Oral     SpO2: 95%  91% 93%  Weight:  74.8 kg      Intake/Output Summary (Last 24 hours) at 10/18/2019 1034 Last data filed at 10/17/2019 2049 Gross per 24 hour  Intake 350 ml  Output --  Net 350 ml   Filed Weights   10/17/19 2200  Weight: 74.8 kg     Exam:  General: Tired appearing female sitting up in bed in no acute respiratory distress although she does have a little cough with speaking.  Mild tachypnea.  No accessory muscle use. Eyes: sclera anicteric, conjuctiva mild injection bilaterally CVS: S1-S2, regular  Respiratory: Reasonable air entry bilaterally with no wheezes or rales. GI: NABS, soft, NT  LE: No edema.  Neuro: A/O x 3, Moving all extremities equally with normal strength, CN 3-12 intact, grossly nonfocal.   Psych: patient is logical and coherent, judgement and insight appear normal, mood and affect appropriate to situation.   Data Reviewed: Assessment & Plan:   49 year old otherwise healthy female is admitted with recent diagnosis of COVID-19 and new shortness of breath and hypoxia.  Chest x-ray shows a subtle nodular opacity that is asymmetrical, not typical of Covid pneumonia.  However her history and bronchospastic cough is very suggestive of COVID-19 pneumonia.  CAP probably secondary to COVID-19 Procalcitonin has returned less than 0.10, will discontinue antibiotics.  CRP is 3.0. Continue oxygen to keep O2 sats greater than 92. Remdesivir day 2/5 Steroids day 2/10 Combivent inhaler and Ventolin inhaler to be kept at bedside for clear bronchospasm  Cough suppressant as needed.  Possible osteoporosis Patient noted to have some kyphosis and possible compression fracture on x-ray. These can be worked up as an outpatient. Hold estrogen   DVT prophylaxis: Lovenox Code Status: Full Family Communication: Patient is in communication with her family Disposition Plan:   Patient is from: Home  Anticipated Discharge Location: Home  Barriers to Discharge: Ongoing hypoxia  Is patient medically stable for Discharge: Not yet   Consultants:  None  Procedures:  None  Antimicrobials:  None   Basic Metabolic Panel: Recent Labs  Lab 10/17/19 1330 10/17/19 1935 10/18/19 0611  NA 138  --  137  K 4.5  --  3.9  CL 102  --  104  CO2 29  --  23  GLUCOSE 87  --  132*  BUN 15  --  15  CREATININE 0.82 0.84 0.53  CALCIUM 8.7*  --  8.6*   Liver Function Tests: Recent Labs  Lab 10/18/19 0611  AST 30  ALT 29  ALKPHOS 110  BILITOT 0.8  PROT 7.0  ALBUMIN 3.4*   No results for input(s): LIPASE, AMYLASE in the last 168 hours. No results for input(s): AMMONIA in the last 168 hours. CBC: Recent Labs  Lab 10/17/19 1242 10/17/19 1935 10/18/19 0611  WBC 4.7 4.2 2.9*   NEUTROABS 2.6  --  1.9  HGB 14.0 13.1 13.3  HCT 41.2 38.9 40.0  MCV 94.3 95.8 93.7  PLT 212 209 235   Cardiac Enzymes: No results for input(s): CKTOTAL, CKMB, CKMBINDEX, TROPONINI in the last 168 hours. BNP (last 3 results) No results for input(s): PROBNP in the last 8760 hours. CBG: No results for input(s): GLUCAP in the last 168 hours.  Recent Results (from the past 240 hour(s))  Respiratory Panel by RT PCR (Flu A&B, Covid) - Nasopharyngeal Swab     Status: Abnormal   Collection Time: 10/17/19  5:37 PM   Specimen: Nasopharyngeal Swab  Result Value Ref Range Status   SARS Coronavirus 2 by RT PCR POSITIVE (A) NEGATIVE Final    Comment: RESULT CALLED TO, READ BACK BY AND VERIFIED WITH: Janice Strickland @1837  09/15/16 MJU (NOTE) SARS-CoV-2 target nucleic acids are DETECTED. SARS-CoV-2 RNA is generally detectable in upper respiratory specimens  during the acute phase of infection. Positive results are indicative of the presence of the identified virus, but do not rule out bacterial infection or co-infection with other pathogens not detected by the test. Clinical correlation with patient history and other diagnostic information is necessary to determine patient infection status. The expected result is Negative. Fact Sheet for Patients:  PinkCheek.be Fact Sheet for Healthcare Providers: GravelBags.it This test is not yet approved or cleared by the [place] FDA and  has been authorized for detection and/or diagnosis of SARS-CoV-2 by FDA under an Emergency Use Authorization (EUA).  This EUA will remain in effect (meaning this test can be used)  for the duration of  the COVID-19 declaration under Section 564(b)(1) of the Act, 21 U.S.C. section 360bbb-3(b)(1), unless the authorization is terminated or revoked sooner.    Influenza A by PCR NEGATIVE NEGATIVE Final   Influenza B by PCR NEGATIVE NEGATIVE Final    Comment:  (NOTE) The Xpert Xpress SARS-CoV-2/FLU/RSV assay is intended as an aid in  the diagnosis of influenza from Nasopharyngeal swab specimens and  should not be used as a sole basis for treatment. Nasal washings and  aspirates are unacceptable for Xpert Xpress SARS-CoV-2/FLU/RSV  testing. Fact Sheet for Patients: PinkCheek.be Fact Sheet for Healthcare Providers: GravelBags.it This test is not yet approved or cleared by the [place] FDA and  has been authorized for detection and/or diagnosis of SARS-CoV-2 by  FDA under an Emergency Use Authorization (EUA). This EUA will remain  in effect (meaning this test can be used) for the duration of the  Covid-19 declaration under Section 564(b)(1) of the Act, 21  U.S.C. section 360bbb-3(b)(1), unless the authorization is  terminated or revoked. Performed at [facility], [address] [place], [place] [address]       Studies: DG Chest 2 View  Addendum Date: 10/17/2019   ADDENDUM REPORT: 10/17/2019 12:07 ADDENDUM: These results were called by telephone at the time  of interpretation on 10/17/2019 at 12:07 pm to provider PHILLIP STAFFORD , who verbally acknowledged these results. Electronically Signed   By: Donzetta Kohut M.D.   On: 10/17/2019 12:07   Result Date: 10/17/2019 CLINICAL DATA:  Shortness of breath, history of COVID infection EXAM: CHEST - 2 VIEW COMPARISON:  None FINDINGS: Cardiomediastinal contours and hilar structures are normal. Subtle nodular opacity in the left upper lobe projecting over the scapular border. No dense consolidation or evidence of pleural effusion. On the lateral view there is added density extending superiorly and posteriorly from the hilum. There is kyphosis at the T12 level. Loss of height at this level of the anterior vertebral body. IMPRESSION: 1. Subtle nodular opacity in the left upper lobe. Findings suspicious for infection. No dense consolidation.  2. Kyphosis at the T12 level. Loss of height at this level anteriorly is age indeterminate, approximately 50% left height anteriorly than adjacent vertebral elementsw. This could be related to congenital vertebral anomaly or trauma/compression fracture. Correlate with any pain in this area to determine whether further imaging may be warranted or with any prior imaging or history that may be helpful. Electronically Signed: By: Donzetta Kohut M.D. On: 10/17/2019 11:57     Scheduled Meds: . dexamethasone (DECADRON) injection  6 mg Intravenous Q24H  . enoxaparin (LOVENOX) injection  40 mg Subcutaneous Q24H  . sodium chloride flush  3 mL Intravenous Q12H   Continuous Infusions: . sodium chloride    . remdesivir 100 mg in NS 100 mL 100 mg (10/18/19 1011)    Principal Problem:   Pneumonia due to COVID-19 virus Active Problems:   RA (rheumatoid arthritis) (HCC)     Janice Strickland, [facility]  If 7PM-7AM, please contact night-coverage [email or web address] Password [email or web address] 10/18/2019, 10:34 AM    LOS: 1 day

## 2019-10-19 ENCOUNTER — Inpatient Hospital Stay: Payer: Self-pay

## 2019-10-19 DIAGNOSIS — U071 COVID-19: Secondary | ICD-10-CM | POA: Diagnosis not present

## 2019-10-19 DIAGNOSIS — M069 Rheumatoid arthritis, unspecified: Secondary | ICD-10-CM | POA: Diagnosis not present

## 2019-10-19 DIAGNOSIS — J1282 Pneumonia due to coronavirus disease 2019: Secondary | ICD-10-CM

## 2019-10-19 LAB — COMPREHENSIVE METABOLIC PANEL
ALT: 31 U/L (ref 0–44)
AST: 29 U/L (ref 15–41)
Albumin: 3.3 g/dL — ABNORMAL LOW (ref 3.5–5.0)
Alkaline Phosphatase: 104 U/L (ref 38–126)
Anion gap: 7 (ref 5–15)
BUN: 21 mg/dL — ABNORMAL HIGH (ref 6–20)
CO2: 24 mmol/L (ref 22–32)
Calcium: 8.5 mg/dL — ABNORMAL LOW (ref 8.9–10.3)
Chloride: 106 mmol/L (ref 98–111)
Creatinine, Ser: 0.65 mg/dL (ref 0.44–1.00)
GFR calc Af Amer: >60 mL/min (ref >60)
GFR calc non Af Amer: >60 mL/min (ref >60)
Glucose, Bld: 138 mg/dL — ABNORMAL HIGH (ref 70–99)
Potassium: 4.1 mmol/L (ref 3.5–5.1)
Sodium: 137 mmol/L (ref 135–145)
Total Bilirubin: 0.5 mg/dL (ref 0.3–1.2)
Total Protein: 7 g/dL (ref 6.5–8.1)

## 2019-10-19 LAB — CBC WITH DIFFERENTIAL/PLATELET
Abs Immature Granulocytes: 0.08 10*3/uL — ABNORMAL HIGH (ref 0.00–0.07)
Basophils Absolute: 0 10*3/uL (ref 0.0–0.1)
Basophils Relative: 0 %
Eosinophils Absolute: 0 10*3/uL (ref 0.0–0.5)
Eosinophils Relative: 0 %
HCT: 42.4 % (ref 36.0–46.0)
Hemoglobin: 13.8 g/dL (ref 12.0–15.0)
Immature Granulocytes: 1 %
Lymphocytes Relative: 18 %
Lymphs Abs: 1.5 10*3/uL (ref 0.7–4.0)
MCH: 31.1 pg (ref 26.0–34.0)
MCHC: 32.5 g/dL (ref 30.0–36.0)
MCV: 95.5 fL (ref 80.0–100.0)
Monocytes Absolute: 0.2 10*3/uL (ref 0.1–1.0)
Monocytes Relative: 2 %
Neutro Abs: 6.8 10*3/uL (ref 1.7–7.7)
Neutrophils Relative %: 79 %
Platelets: 304 10*3/uL (ref 150–400)
RBC: 4.44 MIL/uL (ref 3.87–5.11)
RDW: 12.1 % (ref 11.5–15.5)
WBC: 8.6 10*3/uL (ref 4.0–10.5)
nRBC: 0 % (ref 0.0–0.2)

## 2019-10-19 LAB — C-REACTIVE PROTEIN: CRP: 1 mg/dL — ABNORMAL HIGH (ref <1.0)

## 2019-10-19 LAB — FIBRIN DERIVATIVES D-DIMER (ARMC ONLY): Fibrin derivatives D-dimer (ARMC): 1082.43 ng/mL (FEU) — ABNORMAL HIGH (ref 0.00–499.00)

## 2019-10-19 LAB — PROCALCITONIN: Procalcitonin: <0.1 ng/mL

## 2019-10-19 MED ORDER — SODIUM CHLORIDE 0.9% FLUSH: 10.0000 mL | INTRAVENOUS | Status: DC | PRN

## 2019-10-19 MED ORDER — CHLORHEXIDINE GLUCONATE CLOTH 2 % EX PADS
6.0000 | MEDICATED_PAD | Freq: Every day | CUTANEOUS | Status: DC
  Administered 2019-10-19 – 2019-10-20 (×2): 6 via TOPICAL

## 2019-10-19 MED ORDER — SODIUM CHLORIDE 0.9% FLUSH
10.0000 mL | Freq: Two times a day (BID) | INTRAVENOUS | Status: DC
  Administered 2019-10-19 – 2019-10-21 (×5): 10 mL

## 2019-10-19 NOTE — Progress Notes (Signed)
Securechat sent to Dr Sherryll Burger re VAS Team assessment.  PICC recommended and ordered.

## 2019-10-19 NOTE — Progress Notes (Signed)
Peripherally Inserted Central Catheter Placement  The IV Nurse has discussed with the patient and/or persons authorized to consent for the patient, the purpose of this procedure and the potential benefits and risks involved with this procedure.  The benefits include less needle sticks, lab draws from the catheter, and the patient may be discharged home with the catheter. Risks include, but not limited to, infection, bleeding, blood clot (thrombus formation), and puncture of an artery; nerve damage and irregular heartbeat and possibility to perform a PICC exchange if needed/ordered by physician.  Alternatives to this procedure were also discussed.  Bard Power PICC patient education guide, fact sheet on infection prevention and patient information card has been provided to patient /or left at bedside.    PICC Placement Documentation  PICC Single Lumen 10/19/19 PICC Right Brachial 38 cm 0 cm (Active)  Indication for Insertion or Continuance of Line Limited venous access - need for IV therapy >5 days (PICC only) 10/19/19 1500  Exposed Catheter (cm) 0 cm 10/19/19 1500  Site Assessment Clean;Dry;Intact 10/19/19 1500  Line Status Flushed;Blood return noted;Saline locked 10/19/19 1500  Dressing Type Transparent 10/19/19 1500  Dressing Status Clean;Dry;Intact;Antimicrobial disc in place 10/19/19 1500  Line Care Connections checked and tightened 10/19/19 1500  Dressing Change Due 10/26/19 10/19/19 1500       Edwin Cap 10/19/2019, 3:34 PM

## 2019-10-19 NOTE — Progress Notes (Signed)
PROGRESS NOTE    Janice Strickland  [id number]  DOB: 04/15/1971  DOA: 10/17/2019 PCP: Maryella Shivers Outpatient Specialists:   Hospital course:  49 year old otherwise healthy female developed fevers to 103, malaise, myalgias nausea and vomiting on 10/10/2019.  She was diagnosed with COVID-19 on 10/15/2019 at the [facility].  Fevers improved however patient subsequently developed increasing shortness of breath and DOE.  Chest x-ray showed subtle nodular opacity in the left upper lobe.  She was admitted on 10/17/2019 for likely Covid pneumonia.  O2 sats dropped to high 80s with ambulation.   Subjective:  Rib cage is hurting due to constant coughing.  Short of breath  Objective: Vitals:   10/17/19 2200 10/17/19 2335 10/18/19 0915 10/19/19 0100  BP:  105/60  103/65  Pulse:  (!) 56  68  Resp:  20  20  Temp:    97.8 F (36.6 C)  TempSrc:    Oral  SpO2:  91% 93% 98%  Weight: 74.8 kg       Intake/Output Summary (Last 24 hours) at 10/19/2019 1652 Last data filed at 10/19/2019 0600 Gross per 24 hour  Intake 100 ml  Output -  Net 100 ml   Filed Weights   10/17/19 2200  Weight: 74.8 kg     Exam:  General: Tired appearing female sitting up in bed in no acute respiratory distress although she does have a little cough with speaking.  Mild tachypnea.  No accessory muscle use. Eyes: sclera anicteric, conjuctiva mild injection bilaterally CVS: S1-S2, regular  Respiratory: Reasonable air entry bilaterally with no wheezes or rales. GI: NABS, soft, NT  LE: No edema.  Neuro: A/O x 3, Moving all extremities equally with normal strength, CN 3-12 intact, grossly nonfocal.  Psych: patient is logical and coherent, judgement and insight appear normal, mood and affect appropriate to situation.   Data Reviewed: Assessment & Plan:   49 year old otherwise healthy female is admitted with recent diagnosis of COVID-19 and new shortness of breath and hypoxia.  Chest x-ray shows a  subtle nodular opacity that is asymmetrical, not typical of Covid pneumonia.  However her history and bronchospastic cough is very suggestive of COVID-19 pneumonia.  CAP probably secondary to COVID-19 Procalcitonin has returned less than 0.10, will discontinue antibiotics.  CRP is 3.0. Continue oxygen to keep O2 sats greater than 92. Remdesivir day 3/5 Steroids day 3/10 Combivent inhaler and Ventolin inhaler to be kept at bedside for clear bronchospasm  Cough suppressant as needed.  Possible osteoporosis Patient noted to have some kyphosis and possible compression fracture on x-ray. These can be worked up as an outpatient. Hold estrogen   DVT prophylaxis: Lovenox Code Status: Full Family Communication: Patient is in communication with her family Disposition Plan:   Patient is from: Home  Anticipated Discharge Location: Home  Barriers to Discharge: Needs 2 more doses of IV remdesivir.  Possible discharge Monday after finishing last dose of remdesivir  Is patient medically stable for Discharge: Not yet   Consultants:  None  Procedures:  None  Antimicrobials:  None   Basic Metabolic Panel: Recent Labs  Lab 10/17/19 1330 10/17/19 1935 10/18/19 0611 10/19/19 0731  NA 138  --  137 137  K 4.5  --  3.9 4.1  CL 102  --  104 106  CO2 29  --  23 24  GLUCOSE 87  --  132* 138*  BUN 15  --  15 21*  CREATININE 0.82 0.84 0.53 0.65  CALCIUM  8.7*  --  8.6* 8.5*   Liver Function Tests: Recent Labs  Lab 10/18/19 0611 10/19/19 0731  AST 30 29  ALT 29 31  ALKPHOS 110 104  BILITOT 0.8 0.5  PROT 7.0 7.0  ALBUMIN 3.4* 3.3*   No results for input(s): LIPASE, AMYLASE in the last 168 hours. No results for input(s): AMMONIA in the last 168 hours. CBC: Recent Labs  Lab 10/17/19 1242 10/17/19 1935 10/18/19 0611 10/19/19 0731  WBC 4.7 4.2 2.9* 8.6  NEUTROABS 2.6  --  1.9 6.8  HGB 14.0 13.1 13.3 13.8  HCT 41.2 38.9 40.0 42.4  MCV 94.3 95.8 93.7 95.5  PLT 212 209  235 304   Cardiac Enzymes: No results for input(s): CKTOTAL, CKMB, CKMBINDEX, TROPONINI in the last 168 hours. BNP (last 3 results) No results for input(s): PROBNP in the last 8760 hours. CBG: No results for input(s): GLUCAP in the last 168 hours.  Recent Results (from the past 240 hour(s))  Respiratory Panel by RT PCR (Flu A&B, Covid) - Nasopharyngeal Swab     Status: Abnormal   Collection Time: 10/17/19  5:37 PM   Specimen: Nasopharyngeal Swab  Result Value Ref Range Status   SARS Coronavirus 2 by RT PCR POSITIVE (A) NEGATIVE Final    Comment: RESULT CALLED TO, READ BACK BY AND VERIFIED WITH: CAROLLYN HUTCHINSON @1837  10/17/19 MJU (NOTE) SARS-CoV-2 target nucleic acids are DETECTED. SARS-CoV-2 RNA is generally detectable in upper respiratory specimens  during the acute phase of infection. Positive results are indicative of the presence of the identified virus, but do not rule out bacterial infection or co-infection with other pathogens not detected by the test. Clinical correlation with patient history and other diagnostic information is necessary to determine patient infection status. The expected result is Negative. Fact Sheet for Patients:  12/17/19 Fact Sheet for Healthcare Providers: https://www.moore.com/ This test is not yet approved or cleared by the https://www.young.biz/ FDA and  has been authorized for detection and/or diagnosis of SARS-CoV-2 by FDA under an Emergency Use Authorization (EUA).  This EUA will remain in effect (meaning this test can be used)  for the duration of  the COVID-19 declaration under Section 564(b)(1) of the Act, 21 U.S.C. section 360bbb-3(b)(1), unless the authorization is terminated or revoked sooner.    Influenza A by PCR NEGATIVE NEGATIVE Final   Influenza B by PCR NEGATIVE NEGATIVE Final    Comment: (NOTE) The Xpert Xpress SARS-CoV-2/FLU/RSV assay is intended as an aid in  the diagnosis of  influenza from Nasopharyngeal swab specimens and  should not be used as a sole basis for treatment. Nasal washings and  aspirates are unacceptable for Xpert Xpress SARS-CoV-2/FLU/RSV  testing. Fact Sheet for Patients: [place] Fact Sheet for Healthcare Providers: https://www.moore.com/ This test is not yet approved or cleared by the https://www.young.biz/ FDA and  has been authorized for detection and/or diagnosis of SARS-CoV-2 by  FDA under an Emergency Use Authorization (EUA). This EUA will remain  in effect (meaning this test can be used) for the duration of the  Covid-19 declaration under Section 564(b)(1) of the Act, 21  U.S.C. section 360bbb-3(b)(1), unless the authorization is  terminated or revoked. Performed at [facility] [address] [place], [place]       Studies: [address] EKG SITE RITE  Result Date: 10/19/2019 If [facility] image not attached, placement could not be confirmed due to current cardiac rhythm.    Scheduled Meds: . Chlorhexidine Gluconate Cloth  6 each  Topical Daily  . dexamethasone (DECADRON) injection  6 mg Intravenous Q24H  . enoxaparin (LOVENOX) injection  40 mg Subcutaneous Q24H  . sodium chloride flush  10-40 mL Intracatheter Q12H  . sodium chloride flush  3 mL Intravenous Q12H   Continuous Infusions: . sodium chloride    . remdesivir 100 mg in NS 100 mL 100 mg (10/19/19 1031)    Principal Problem:   Pneumonia due to COVID-19 virus Active Problems:   RA (rheumatoid arthritis) ([place])     Breia Ocampo Manuella Ghazi, [facility]  If 7PM-7AM, please contact night-coverage [email or web address] Password [email or web address] 10/19/2019, 4:52 PM    LOS: 2 days

## 2019-10-19 NOTE — Progress Notes (Signed)
A consult was placed to IV Therapy for a new peripheral iv; both arms scanned with ultrasound;  Pt with extremely poor veins;  Able to place PIV on 2nd attempt ; flushed easily w NS; pt was able to smell and taste the NS;  Suggest a PICC line if this iv does not last for Remdesivir;  RN aware ;

## 2019-10-20 LAB — CBC WITH DIFFERENTIAL/PLATELET
Abs Immature Granulocytes: 0.11 10*3/uL — ABNORMAL HIGH (ref 0.00–0.07)
Basophils Absolute: 0 10*3/uL (ref 0.0–0.1)
Basophils Relative: 0 %
Eosinophils Absolute: 0 10*3/uL (ref 0.0–0.5)
Eosinophils Relative: 0 %
HCT: 40.2 % (ref 36.0–46.0)
Hemoglobin: 13.2 g/dL (ref 12.0–15.0)
Immature Granulocytes: 1 %
Lymphocytes Relative: 14 %
Lymphs Abs: 1.3 10*3/uL (ref 0.7–4.0)
MCH: 31.5 pg (ref 26.0–34.0)
MCHC: 32.8 g/dL (ref 30.0–36.0)
MCV: 95.9 fL (ref 80.0–100.0)
Monocytes Absolute: 0.2 10*3/uL (ref 0.1–1.0)
Monocytes Relative: 2 %
Neutro Abs: 7.5 10*3/uL (ref 1.7–7.7)
Neutrophils Relative %: 83 %
Platelets: 314 10*3/uL (ref 150–400)
RBC: 4.19 MIL/uL (ref 3.87–5.11)
RDW: 12.3 % (ref 11.5–15.5)
WBC: 9.1 10*3/uL (ref 4.0–10.5)
nRBC: 0 % (ref 0.0–0.2)

## 2019-10-20 LAB — COMPREHENSIVE METABOLIC PANEL
ALT: 26 U/L (ref 0–44)
AST: 23 U/L (ref 15–41)
Albumin: 3.1 g/dL — ABNORMAL LOW (ref 3.5–5.0)
Alkaline Phosphatase: 93 U/L (ref 38–126)
Anion gap: 6 (ref 5–15)
BUN: 20 mg/dL (ref 6–20)
CO2: 25 mmol/L (ref 22–32)
Calcium: 8.6 mg/dL — ABNORMAL LOW (ref 8.9–10.3)
Chloride: 106 mmol/L (ref 98–111)
Creatinine, Ser: 0.71 mg/dL (ref 0.44–1.00)
GFR calc Af Amer: >60 mL/min (ref >60)
GFR calc non Af Amer: >60 mL/min (ref >60)
Glucose, Bld: 147 mg/dL — ABNORMAL HIGH (ref 70–99)
Potassium: 4.4 mmol/L (ref 3.5–5.1)
Sodium: 137 mmol/L (ref 135–145)
Total Bilirubin: 0.5 mg/dL (ref 0.3–1.2)
Total Protein: 6.6 g/dL (ref 6.5–8.1)

## 2019-10-20 LAB — FIBRIN DERIVATIVES D-DIMER (ARMC ONLY): Fibrin derivatives D-dimer (ARMC): 810.95 ng/mL (FEU) — ABNORMAL HIGH (ref 0.00–499.00)

## 2019-10-20 LAB — C-REACTIVE PROTEIN: CRP: 0.7 mg/dL (ref <1.0)

## 2019-10-20 NOTE — Progress Notes (Signed)
PROGRESS NOTE    Janice Strickland  FTD:[id number]  DOB: 1971-05-07  DOA: 10/17/2019 PCP: [facility]:   Hospital course:  49 year old otherwise healthy female developed fevers to 103, malaise, myalgias nausea and vomiting on 10/10/2019.  She was diagnosed with COVID-19 on 10/15/2019 at the [facility]  Fevers improved however patient subsequently developed increasing shortness of breath and DOE.  Chest x-ray showed subtle nodular opacity in the left upper lobe.  She was admitted on 10/17/2019 for likely Covid pneumonia.  O2 sats dropped to high 80s with ambulation.   Subjective:  Rib cage is hurting due to constant coughing.  Shortness of breath improving, coughing remains. 96% on room air  Objective: Vitals:   10/17/19 2335 10/18/19 0915 10/19/19 0100 10/20/19 0016  BP: 105/60  103/65 (!) 100/59  Pulse: (!) 56  68 (!) 58  Resp: 20  20 20   Temp:   97.8 F (36.6 C) (!) 97.5 F (36.4 C)  TempSrc:   Oral Oral  SpO2: 91% 93% 98% 96%  Weight:        Intake/Output Summary (Last 24 hours) at 10/20/2019 1336 Last data filed at 10/19/2019 2012 Gross per 24 hour  Intake --  Output 0 ml  Net 0 ml   Filed Weights   10/17/19 2200  Weight: 74.8 kg     Exam:  General: Tired appearing female sitting up in bed in no acute respiratory distress although she does have a little cough with speaking.  Mild tachypnea.  No accessory muscle use. Eyes: sclera anicteric, conjuctiva mild injection bilaterally CVS: S1-S2, regular  Respiratory: Good air entry bilaterally with no wheezes or rales. GI: NABS, soft, NT  LE: No edema.  Neuro: A/O x 3, Moving all extremities equally with normal strength, CN 3-12 intact, grossly nonfocal.  Psych: patient is logical and coherent, judgement and insight appear normal, mood and affect appropriate to situation.   Data Reviewed: Assessment & Plan:   49 year old otherwise healthy female is admitted with recent  diagnosis of COVID-19 and new shortness of breath and hypoxia.  Chest x-ray shows a subtle nodular opacity that is asymmetrical, not typical of Covid pneumonia.  However her history and bronchospastic cough is very suggestive of COVID-19 pneumonia.  CAP probably secondary to COVID-19 Procalcitonin has returned less than 0.10, off antibiotics.  CRP is 3.0. Continue oxygen to keep O2 sats greater than 92. Remdesivir day 4/5 Steroids day 4/10 Combivent inhaler and Ventolin inhaler to be kept at bedside for clear bronchospasm  Cough suppressant as needed.  Possible osteoporosis Patient noted to have some kyphosis and possible compression fracture on x-ray. These can be worked up as an outpatient. Hold estrogen   DVT prophylaxis: Lovenox Code Status: Full Family Communication: Patient is in communication with her family Disposition Plan:   Patient is from: Home  Anticipated Discharge Location: Home  Barriers to Discharge: Needs 1 more doses of IV remdesivir.  Possible discharge Monday after finishing last dose of remdesivir  Is patient medically stable for Discharge: Not yet   Consultants:  None  Procedures:  None  Antimicrobials:  None   Basic Metabolic Panel: Recent Labs  Lab 10/17/19 1330 10/17/19 1935 10/18/19 0611 10/19/19 0731 10/20/19 0421  NA 138  --  137 137 137  K 4.5  --  3.9 4.1 4.4  CL 102  --  104 106 106  CO2 29  --  23 24 25   GLUCOSE 87  --  132*  138* 147*  BUN 15  --  15 21* 20  CREATININE 0.82 0.84 0.53 0.65 0.71  CALCIUM 8.7*  --  8.6* 8.5* 8.6*   Liver Function Tests: Recent Labs  Lab 10/18/19 0611 10/19/19 0731 10/20/19 0421  AST 30 29 23   ALT 29 31 26   ALKPHOS 110 104 93  BILITOT 0.8 0.5 0.5  PROT 7.0 7.0 6.6  ALBUMIN 3.4* 3.3* 3.1*   No results for input(s): LIPASE, AMYLASE in the last 168 hours. No results for input(s): AMMONIA in the last 168 hours. CBC: Recent Labs  Lab 10/17/19 1242 10/17/19 1935 10/18/19 0611  10/19/19 0731 10/20/19 0421  WBC 4.7 4.2 2.9* 8.6 9.1  NEUTROABS 2.6  --  1.9 6.8 7.5  HGB 14.0 13.1 13.3 13.8 13.2  HCT 41.2 38.9 40.0 42.4 40.2  MCV 94.3 95.8 93.7 95.5 95.9  PLT 212 209 235 304 314   Cardiac Enzymes: No results for input(s): CKTOTAL, CKMB, CKMBINDEX, TROPONINI in the last 168 hours. BNP (last 3 results) No results for input(s): PROBNP in the last 8760 hours. CBG: No results for input(s): GLUCAP in the last 168 hours.  Recent Results (from the past 240 hour(s))  Respiratory Panel by RT PCR (Flu A&B, Covid) - Nasopharyngeal Swab     Status: Abnormal   Collection Time: 10/17/19  5:37 PM   Specimen: Nasopharyngeal Swab  Result Value Ref Range Status   SARS Coronavirus 2 by RT PCR POSITIVE (A) NEGATIVE Final    Comment: RESULT CALLED TO, READ BACK BY AND VERIFIED WITH: CAROLLYN HUTCHINSON @1837  10/17/19 MJU (NOTE) SARS-CoV-2 target nucleic acids are DETECTED. SARS-CoV-2 RNA is generally detectable in upper respiratory specimens  during the acute phase of infection. Positive results are indicative of the presence of the identified virus, but do not rule out bacterial infection or co-infection with other pathogens not detected by the test. Clinical correlation with patient history and other diagnostic information is necessary to determine patient infection status. The expected result is Negative. Fact Sheet for Patients:  12/17/19 Fact Sheet for Healthcare Providers: This test is not yet approved or cleared by the 12/17/19 FDA and  has been authorized for detection and/or diagnosis of SARS-CoV-2 by FDA under an Emergency Use Authorization (EUA).  This EUA will remain in effect (meaning this test can be used)  for the duration of  the COVID-19 declaration under Section 564(b)(1) of the Act, 21 U.S.C. section 360bbb-3(b)(1), unless the authorization is terminated or revoked sooner.      Influenza A by PCR NEGATIVE NEGATIVE Final   Influenza B by PCR NEGATIVE NEGATIVE Final    Comment: (NOTE) The Xpert Xpress SARS-CoV-2/FLU/RSV assay is intended as an aid in  the diagnosis of influenza from Nasopharyngeal swab specimens and  should not be used as a sole basis for treatment. Nasal washings and  aspirates are unacceptable for Xpert Xpress SARS-CoV-2/FLU/RSV  testing. Fact Sheet for Patients: https://www.moore.com/ Fact Sheet for Healthcare Providers: https://www.young.biz/ This test is not yet approved or cleared by the [place] FDA and  has been authorized for detection and/or diagnosis of SARS-CoV-2 by  FDA under an Emergency Use Authorization (EUA). This EUA will remain  in effect (meaning this test can be used) for the duration of the  Covid-19 declaration under Section 564(b)(1) of the Act, 21  U.S.C. section 360bbb-3(b)(1), unless the authorization is  terminated or revoked. Performed at [facility], [address], [place], [facility] [address]  Studies: [place] EKG SITE RITE  Result Date: 10/19/2019 If Site Rite image not attached, placement could not be confirmed due to current cardiac rhythm.    Scheduled Meds: . Chlorhexidine Gluconate Cloth  6 each Topical Daily  . dexamethasone (DECADRON) injection  6 mg Intravenous Q24H  . enoxaparin (LOVENOX) injection  40 mg Subcutaneous Q24H  . sodium chloride flush  10-40 mL Intracatheter Q12H  . sodium chloride flush  3 mL Intravenous Q12H   Continuous Infusions: . sodium chloride    . remdesivir 100 mg in NS 100 mL 100 mg (10/20/19 0856)    Principal Problem:   Pneumonia due to COVID-19 virus Active Problems:   RA (rheumatoid arthritis) (HCC)     Garek Schuneman Sherryll Burger, [facility]  If 7PM-7AM, please contact night-coverage [email or web address] Password [email or web address] 10/20/2019, 1:36 PM    LOS: 3 days

## 2019-10-21 LAB — CBC WITH DIFFERENTIAL/PLATELET
Abs Immature Granulocytes: 0.2 10*3/uL — ABNORMAL HIGH (ref 0.00–0.07)
Basophils Absolute: 0 10*3/uL (ref 0.0–0.1)
Basophils Relative: 0 %
Eosinophils Absolute: 0 10*3/uL (ref 0.0–0.5)
Eosinophils Relative: 0 %
HCT: 42.4 % (ref 36.0–46.0)
Hemoglobin: 14.1 g/dL (ref 12.0–15.0)
Immature Granulocytes: 3 %
Lymphocytes Relative: 22 %
Lymphs Abs: 1.6 10*3/uL (ref 0.7–4.0)
MCH: 31.5 pg (ref 26.0–34.0)
MCHC: 33.3 g/dL (ref 30.0–36.0)
MCV: 94.9 fL (ref 80.0–100.0)
Monocytes Absolute: 0.3 10*3/uL (ref 0.1–1.0)
Monocytes Relative: 5 %
Neutro Abs: 5.1 10*3/uL (ref 1.7–7.7)
Neutrophils Relative %: 70 %
Platelets: 336 10*3/uL (ref 150–400)
RBC: 4.47 MIL/uL (ref 3.87–5.11)
RDW: 12 % (ref 11.5–15.5)
WBC: 7.3 10*3/uL (ref 4.0–10.5)
nRBC: 0 % (ref 0.0–0.2)

## 2019-10-21 LAB — COMPREHENSIVE METABOLIC PANEL
ALT: 28 U/L (ref 0–44)
AST: 20 U/L (ref 15–41)
Albumin: 3.3 g/dL — ABNORMAL LOW (ref 3.5–5.0)
Alkaline Phosphatase: 91 U/L (ref 38–126)
Anion gap: 7 (ref 5–15)
BUN: 20 mg/dL (ref 6–20)
CO2: 25 mmol/L (ref 22–32)
Calcium: 8.6 mg/dL — ABNORMAL LOW (ref 8.9–10.3)
Chloride: 104 mmol/L (ref 98–111)
Creatinine, Ser: 0.66 mg/dL (ref 0.44–1.00)
GFR calc Af Amer: >60 mL/min (ref >60)
GFR calc non Af Amer: >60 mL/min (ref >60)
Glucose, Bld: 148 mg/dL — ABNORMAL HIGH (ref 70–99)
Potassium: 4.2 mmol/L (ref 3.5–5.1)
Sodium: 136 mmol/L (ref 135–145)
Total Bilirubin: 0.5 mg/dL (ref 0.3–1.2)
Total Protein: 6.6 g/dL (ref 6.5–8.1)

## 2019-10-21 LAB — C-REACTIVE PROTEIN: CRP: 1.2 mg/dL — ABNORMAL HIGH (ref <1.0)

## 2019-10-21 LAB — FIBRIN DERIVATIVES D-DIMER (ARMC ONLY): Fibrin derivatives D-dimer (ARMC): 527.7 ng/mL (FEU) — ABNORMAL HIGH (ref 0.00–499.00)

## 2019-10-21 MED ORDER — SODIUM CHLORIDE 0.9 % IV BOLUS
1000.0000 mL | Freq: Once | INTRAVENOUS | Status: AC
  Administered 2019-10-21: 09:00:00 1000 mL via INTRAVENOUS

## 2019-10-21 MED ORDER — VITAMIN C 250 MG PO TABS: 250.0000 mg | ORAL_TABLET | Freq: Every day | ORAL | 0 refills | Status: DC

## 2019-10-21 MED ORDER — ZINC SULFATE 220 (50 ZN) MG PO CAPS: 220.0000 mg | ORAL_CAPSULE | Freq: Two times a day (BID) | ORAL | 0 refills | Status: DC

## 2019-10-21 NOTE — Plan of Care (Signed)

## 2019-10-21 NOTE — Discharge Summary (Signed)
5        [place] at [facility]   PATIENT NAME: Janice Strickland    MR#:  [id number]  DATE OF BIRTH:  [birth year]  DATE OF ADMISSION:  10/17/2019   ADMITTING PHYSICIAN: Pieter Partridge, MD  DATE OF DISCHARGE: 10/21/2019 12:21 PM  PRIMARY CARE PHYSICIAN: Trey Sailors, PA-C   ADMISSION DIAGNOSIS:  Pneumonia due to COVID-19 virus [U07.1, J12.82] DISCHARGE DIAGNOSIS:  Principal Problem:   Pneumonia due to COVID-19 virus Active Problems:   RA (rheumatoid arthritis) (HCC)  SECONDARY DIAGNOSIS:   Past Medical History:  Diagnosis Date  . Motion sickness    back seat - cars  . RA (rheumatoid arthritis) (HCC)   . Trochanteric bursitis of left hip   . Vertigo    during seasonal allergy season   HOSPITAL COURSE:  49 year old otherwise healthy female is admitted with recent diagnosis of COVID-19 and new shortness of breath and hypoxia. Chest x-ray showed a subtle nodular opacity that is asymmetrical, not typical of Covid pneumonia. However her history and bronchospastic cough is very suggestive of COVID-19 pneumonia.  CAP probably secondary to COVID-19 Completed course of remdesivir and steroids Responded well to remdesivir, steroids, metered-dose inhaler. Cough suppressant as needed.  Possible osteoporosis Patient noted to have some kyphosis and possible compression fracture on x-ray. These can be worked up as an outpatient. Hold estrogen until reevaluated by her PCP/OBGYN   DISCHARGE CONDITIONS:  Stable CONSULTS OBTAINED:   DRUG ALLERGIES:  No Known Allergies DISCHARGE MEDICATIONS:   Allergies as of 10/21/2019   No Known Allergies     Medication List    STOP taking these medications   estradiol 1 MG tablet Commonly known as: Estrace     TAKE these medications   vitamin C 250 MG tablet Commonly known as: ASCORBIC ACID Take 1 tablet (250 mg total) by mouth daily.   zinc sulfate 220 (50 Zn) MG capsule Take 1 capsule (220 mg total) by  mouth 2 (two) times daily.      DISCHARGE INSTRUCTIONS:   DIET:  Regular diet DISCHARGE CONDITION:  Stable ACTIVITY:  Activity as tolerated OXYGEN:  Home Oxygen: No.  Oxygen Delivery: room air DISCHARGE LOCATION:  home   If you experience worsening of your admission symptoms, develop shortness of breath, life threatening emergency, suicidal or homicidal thoughts you must seek medical attention immediately by calling 911 or calling your MD immediately  if symptoms less severe.  You Must read complete instructions/literature along with all the possible adverse reactions/side effects for all the Medicines you take and that have been prescribed to you. Take any new Medicines after you have completely understood and accpet all the possible adverse reactions/side effects.   Please note  You were cared for by a hospitalist during your hospital stay. If you have any questions about your discharge medications or the care you received while you were in the hospital after you are discharged, you can call the unit and asked to speak with the hospitalist on call if the hospitalist that took care of you is not available. Once you are discharged, your primary care physician will handle any further medical issues. Please note that NO REFILLS for any discharge medications will be authorized once you are discharged, as it is imperative that you return to your primary care physician (or establish a relationship with a primary care physician if you do not have one) for your aftercare needs so that they can reassess your need for medications and  monitor your lab values.    On the day of Discharge:  VITAL SIGNS:  Blood pressure (!) 84/64, pulse (!) 52, temperature 97.7 F (36.5 C), temperature source Oral, resp. rate 17, height 5\' 5"  (1.651 m), weight 74.8 kg, SpO2 99 %. PHYSICAL EXAMINATION:  GENERAL:  49 y.o.-year-old patient lying in the bed with no acute distress.  EYES: Pupils equal, round, reactive  to light and accommodation. No scleral icterus. Extraocular muscles intact.  HEENT: Head atraumatic, normocephalic. Oropharynx and nasopharynx clear.  NECK:  Supple, no jugular venous distention. No thyroid enlargement, no tenderness.  LUNGS: Normal breath sounds bilaterally, no wheezing, rales,rhonchi or crepitation. No use of accessory muscles of respiration.  CARDIOVASCULAR: S1, S2 normal. No murmurs, rubs, or gallops.  ABDOMEN: Soft, non-tender, non-distended. Bowel sounds present. No organomegaly or mass.  EXTREMITIES: No pedal edema, cyanosis, or clubbing.  NEUROLOGIC: Cranial nerves II through XII are intact. Muscle strength 5/5 in all extremities. Sensation intact. Gait not checked.  PSYCHIATRIC: The patient is alert and oriented x 3.  SKIN: No obvious rash, lesion, or ulcer.  DATA REVIEW:   CBC Recent Labs  Lab 10/21/19 0621  WBC 7.3  HGB 14.1  HCT 42.4  PLT 336    Chemistries  Recent Labs  Lab 10/21/19 0621  NA 136  K 4.2  CL 104  CO2 25  GLUCOSE 148*  BUN 20  CREATININE 0.66  CALCIUM 8.6*  AST 20  ALT 28  ALKPHOS 91  BILITOT 0.5     Outpatient follow-up Follow-up Information    Trinna Post, PA-C. Go on 11/04/2019.   Specialty: Physician Assistant Why: @3 :20 PM  Contact information: [address] [place] [address] [phone]            Management plans discussed with the patient, family and they are in agreement.  CODE STATUS: Full Code   TOTAL TIME TAKING CARE OF THIS PATIENT: 45 minutes.    Max Sane M.D on 10/21/2019 at 3:38 PM  [facility]   CC: Primary care physician; Trinna Post, PA-C   Note: This dictation was prepared with Dragon dictation along with smaller phrase technology. Any transcriptional errors that result from this process are unintentional.

## 2019-10-21 NOTE — Discharge Instructions (Signed)
COVID-19 COVID-19 is a respiratory infection that is caused by a virus called severe acute respiratory syndrome coronavirus 2 (SARS-CoV-2). The disease is also known as coronavirus disease or novel coronavirus. In some people, the virus may not cause any symptoms. In others, it may cause a serious infection. The infection can get worse quickly and can lead to complications, such as:  Pneumonia, or infection of the lungs.  Acute respiratory distress syndrome or ARDS. This is a condition in which fluid build-up in the lungs prevents the lungs from filling with air and passing oxygen into the blood.  Acute respiratory failure. This is a condition in which there is not enough oxygen passing from the lungs to the body or when carbon dioxide is not passing from the lungs out of the body.  Sepsis or septic shock. This is a serious bodily reaction to an infection.  Blood clotting problems.  Secondary infections due to bacteria or fungus.  Organ failure. This is when your body's organs stop working. The virus that causes COVID-19 is contagious. This means that it can spread from person to person through droplets from coughs and sneezes (respiratory secretions). What are the causes? This illness is caused by a virus. You may catch the virus by:  Breathing in droplets from an infected person. Droplets can be spread by a person breathing, speaking, singing, coughing, or sneezing.  Touching something, like a table or a doorknob, that was exposed to the virus (contaminated) and then touching your mouth, nose, or eyes. What increases the risk? Risk for infection You are more likely to be infected with this virus if you:  Are within 6 feet (2 meters) of a person with COVID-19.  Provide care for or live with a person who is infected with COVID-19.  Spend time in crowded indoor spaces or live in shared housing. Risk for serious illness You are more likely to become seriously ill from the virus if  you:  Are 50 years of age or older. The higher your age, the more you are at risk for serious illness.  Live in a nursing home or long-term care facility.  Have cancer.  Have a long-term (chronic) disease such as: ? Chronic lung disease, including chronic obstructive pulmonary disease or asthma. ? A long-term disease that lowers your body's ability to fight infection (immunocompromised). ? Heart disease, including heart failure, a condition in which the arteries that lead to the heart become narrow or blocked (coronary artery disease), a disease which makes the heart muscle thick, weak, or stiff (cardiomyopathy). ? Diabetes. ? Chronic kidney disease. ? Sickle cell disease, a condition in which red blood cells have an abnormal "sickle" shape. ? Liver disease.  Are obese. What are the signs or symptoms? Symptoms of this condition can range from mild to severe. Symptoms may appear any time from 2 to 14 days after being exposed to the virus. They include:  A fever or chills.  A cough.  Difficulty breathing.  Headaches, body aches, or muscle aches.  Runny or stuffy (congested) nose.  A sore throat.  New loss of taste or smell. Some people may also have stomach problems, such as nausea, vomiting, or diarrhea. Other people may not have any symptoms of COVID-19. How is this diagnosed? This condition may be diagnosed based on:  Your signs and symptoms, especially if: ? You live in an area with a COVID-19 outbreak. ? You recently traveled to or from an area where the virus is common. ? You   provide care for or live with a person who was diagnosed with COVID-19. ? You were exposed to a person who was diagnosed with COVID-19.  A physical exam.  Lab tests, which may include: ? Taking a sample of fluid from the back of your nose and throat (nasopharyngeal fluid), your nose, or your throat using a swab. ? A sample of mucus from your lungs (sputum). ? Blood tests.  Imaging tests,  which may include, X-rays, CT scan, or ultrasound. How is this treated? At present, there is no medicine to treat COVID-19. Medicines that treat other diseases are being used on a trial basis to see if they are effective against COVID-19. Your health care provider will talk with you about ways to treat your symptoms. For most people, the infection is mild and can be managed at home with rest, fluids, and over-the-counter medicines. Treatment for a serious infection usually takes places in a hospital intensive care unit (ICU). It may include one or more of the following treatments. These treatments are given until your symptoms improve.  Receiving fluids and medicines through an IV.  Supplemental oxygen. Extra oxygen is given through a tube in the nose, a face mask, or a hood.  Positioning you to lie on your stomach (prone position). This makes it easier for oxygen to get into the lungs.  Continuous positive airway pressure (CPAP) or bi-level positive airway pressure (BPAP) machine. This treatment uses mild air pressure to keep the airways open. A tube that is connected to a motor delivers oxygen to the body.  Ventilator. This treatment moves air into and out of the lungs by using a tube that is placed in your windpipe.  Tracheostomy. This is a procedure to create a hole in the neck so that a breathing tube can be inserted.  Extracorporeal membrane oxygenation (ECMO). This procedure gives the lungs a chance to recover by taking over the functions of the heart and lungs. It supplies oxygen to the body and removes carbon dioxide. Follow these instructions at home: Lifestyle  If you are sick, stay home except to get medical care. Your health care provider will tell you how long to stay home. Call your health care provider before you go for medical care.  Rest at home as told by your health care provider.  Do not use any products that contain nicotine or tobacco, such as cigarettes,  e-cigarettes, and chewing tobacco. If you need help quitting, ask your health care provider.  Return to your normal activities as told by your health care provider. Ask your health care provider what activities are safe for you. General instructions  Take over-the-counter and prescription medicines only as told by your health care provider.  Drink enough fluid to keep your urine pale yellow.  Keep all follow-up visits as told by your health care provider. This is important. How is this prevented?  There is no vaccine to help prevent COVID-19 infection. However, there are steps you can take to protect yourself and others from this virus. To protect yourself:   Do not travel to areas where COVID-19 is a risk. The areas where COVID-19 is reported change often. To identify high-risk areas and travel restrictions, check the CDC travel website: wwwnc.cdc.gov/travel/notices  If you live in, or must travel to, an area where COVID-19 is a risk, take precautions to avoid infection. ? Stay away from people who are sick. ? Wash your hands often with soap and water for 20 seconds. If soap and water   are not available, use an alcohol-based hand sanitizer. ? Avoid touching your mouth, face, eyes, or nose. ? Avoid going out in public, follow guidance from your state and local health authorities. ? If you must go out in public, wear a cloth face covering or face mask. Make sure your mask covers your nose and mouth. ? Avoid crowded indoor spaces. Stay at least 6 feet (2 meters) away from others. ? Disinfect objects and surfaces that are frequently touched every day. This may include:  Counters and tables.  Doorknobs and light switches.  Sinks and faucets.  Electronics, such as phones, remote controls, keyboards, computers, and tablets. To protect others: If you have symptoms of COVID-19, take steps to prevent the virus from spreading to others.  If you think you have a COVID-19 infection, contact  your health care provider right away. Tell your health care team that you think you may have a COVID-19 infection.  Stay home. Leave your house only to seek medical care. Do not use public transport.  Do not travel while you are sick.  Wash your hands often with soap and water for 20 seconds. If soap and water are not available, use alcohol-based hand sanitizer.  Stay away from other members of your household. Let healthy household members care for children and pets, if possible. If you have to care for children or pets, wash your hands often and wear a mask. If possible, stay in your own room, separate from others. Use a different bathroom.  Make sure that all people in your household wash their hands well and often.  Cough or sneeze into a tissue or your sleeve or elbow. Do not cough or sneeze into your hand or into the air.  Wear a cloth face covering or face mask. Make sure your mask covers your nose and mouth. Where to find more information  Centers for Disease Control and Prevention: www.cdc.gov/coronavirus/2019-ncov/index.html  World Health Organization: www.who.int/health-topics/coronavirus Contact a health care provider if:  You live in or have traveled to an area where COVID-19 is a risk and you have symptoms of the infection.  You have had contact with someone who has COVID-19 and you have symptoms of the infection. Get help right away if:  You have trouble breathing.  You have pain or pressure in your chest.  You have confusion.  You have bluish lips and fingernails.  You have difficulty waking from sleep.  You have symptoms that get worse. These symptoms may represent a serious problem that is an emergency. Do not wait to see if the symptoms will go away. Get medical help right away. Call your local emergency services (911 in the [place] Do not drive yourself to the hospital. Let the emergency medical personnel know if you think you have  COVID-19. Summary  COVID-19 is a respiratory infection that is caused by a virus. It is also known as coronavirus disease or novel coronavirus. It can cause serious infections, such as pneumonia, acute respiratory distress syndrome, acute respiratory failure, or sepsis.  The virus that causes COVID-19 is contagious. This means that it can spread from person to person through droplets from breathing, speaking, singing, coughing, or sneezing.  You are more likely to develop a serious illness if you are 50 years of age or older, have a weak immune system, live in a nursing home, or have chronic disease.  There is no medicine to treat COVID-19. Your health care provider will talk with you about ways to treat your symptoms.    Take steps to protect yourself and others from infection. Wash your hands often and disinfect objects and surfaces that are frequently touched every day. Stay away from people who are sick and wear a mask if you are sick. This information is not intended to replace advice given to you by your health care provider. Make sure you discuss any questions you have with your health care provider. Document Revised: 04/26/2019 Document Reviewed: 08/02/2018 Elsevier Patient Education  2020 [employer].   COVID-19 Frequently Asked Questions COVID-19 (coronavirus disease) is an infection that is caused by a large family of viruses. Some viruses cause illness in people and others cause illness in animals like camels, cats, and bats. In some cases, the viruses that cause illness in animals can spread to humans. Where did the coronavirus come from? In December 2019, [place] told the [facility]) of several cases of lung disease (human respiratory illness). These cases were linked to an open seafood and livestock market in the city of [place]. The link to the seafood and livestock market suggests that the virus may have spread from animals to humans. However, since that first  outbreak in December, the virus has also been shown to spread from person to person. What is the name of the disease and the virus? Disease name Early on, this disease was called novel coronavirus. This is because scientists determined that the disease was caused by a new (novel) respiratory virus. The World Health Organization [facility]) has now named the disease COVID-19, or coronavirus disease. Virus name The virus that causes the disease is called severe acute respiratory syndrome coronavirus 2 (SARS-CoV-2). More information on disease and virus naming World Health Organization [facility]): www.who.int/emergencies/diseases/novel-coronavirus-2019/technical-guidance/naming-the-coronavirus-disease-(covid-2019)-and-the-virus-that-causes-it Who is at risk for complications from coronavirus disease? Some people may be at higher risk for complications from coronavirus disease. This includes older adults and people who have chronic diseases, such as heart disease, diabetes, and lung disease. If you are at higher risk for complications, take these extra precautions:  Stay home as much as possible.  Avoid social gatherings and travel.  Avoid close contact with others. Stay at least 6 ft (2 m) away from others, if possible.  Wash your hands often with soap and water for at least 20 seconds.  Avoid touching your face, mouth, nose, or eyes.  Keep supplies on hand at home, such as food, medicine, and cleaning supplies.  If you must go out in public, wear a cloth face covering or face mask. Make sure your mask covers your nose and mouth. How does coronavirus disease spread? The virus that causes coronavirus disease spreads easily from person to person (is contagious). You may catch the virus by:  Breathing in droplets from an infected person. Droplets can be spread by a person breathing, speaking, singing, coughing, or sneezing.  Touching something, like a table or a doorknob, that was exposed to the virus  (contaminated) and then touching your mouth, nose, or eyes. Can I get the virus from touching surfaces or objects? There is still a lot that we do not know about the virus that causes coronavirus disease. Scientists are basing a lot of information on what they know about similar viruses, such as:  Viruses cannot generally survive on surfaces for long. They need a human body (host) to survive.  It is more likely that the virus is spread by close contact with people who are sick (direct contact), such as through: ? Shaking hands or hugging. ? Breathing in respiratory droplets  that travel through the air. Droplets can be spread by a person breathing, speaking, singing, coughing, or sneezing.  It is less likely that the virus is spread when a person touches a surface or object that has the virus on it (indirect contact). The virus may be able to enter the body if the person touches a surface or object and then touches his or her face, eyes, nose, or mouth. Can a person spread the virus without having symptoms of the disease? It may be possible for the virus to spread before a person has symptoms of the disease, but this is most likely not the main way the virus is spreading. It is more likely for the virus to spread by being in close contact with people who are sick and breathing in the respiratory droplets spread by a person breathing, speaking, singing, coughing, or sneezing. What are the symptoms of coronavirus disease? Symptoms vary from person to person and can range from mild to severe. Symptoms may include:  Fever or chills.  Cough.  Difficulty breathing or feeling short of breath.  Headaches, body aches, or muscle aches.  Runny or stuffy (congested) nose.  Sore throat.  New loss of taste or smell.  Nausea, vomiting, or diarrhea. These symptoms can appear anywhere from 2 to 14 days after you have been exposed to the virus. Some people may not have any symptoms. If you develop  symptoms, call your health care provider. People with severe symptoms may need hospital care. Should I be tested for this virus? Your health care provider will decide whether to test you based on your symptoms, history of exposure, and your risk factors. How does a health care provider test for this virus? Health care providers will collect samples to send for testing. Samples may include:  Taking a swab of fluid from the back of your nose and throat, your nose, or your throat.  Taking fluid from the lungs by having you cough up mucus (sputum) into a sterile cup.  Taking a blood sample. Is there a treatment or vaccine for this virus? Currently, there is no vaccine to prevent coronavirus disease. Also, there are no medicines like antibiotics or antivirals to treat the virus. A person who becomes sick is given supportive care, which means rest and fluids. A person may also relieve his or her symptoms by using over-the-counter medicines that treat sneezing, coughing, and runny nose. These are the same medicines that a person takes for the common cold. If you develop symptoms, call your health care provider. People with severe symptoms may need hospital care. What can I do to protect myself and my family from this virus?     You can protect yourself and your family by taking the same actions that you would take to prevent the spread of other viruses. Take the following actions:  Wash your hands often with soap and water for at least 20 seconds. If soap and water are not available, use alcohol-based hand sanitizer.  Avoid touching your face, mouth, nose, or eyes.  Cough or sneeze into a tissue, sleeve, or elbow. Do not cough or sneeze into your hand or the air. ? If you cough or sneeze into a tissue, throw it away immediately and wash your hands.  Disinfect objects and surfaces that you frequently touch every day.  Stay away from people who are sick.  Avoid going out in public, follow  guidance from your state and local health authorities.  Avoid crowded indoor spaces.   Stay at least 6 ft (2 m) away from others.  If you must go out in public, wear a cloth face covering or face mask. Make sure your mask covers your nose and mouth.  Stay home if you are sick, except to get medical care. Call your health care provider before you get medical care. Your health care provider will tell you how long to stay home.  Make sure your vaccines are up to date. Ask your health care provider what vaccines you need. What should I do if I need to travel? Follow travel recommendations from your local health authority, the CDC, and WHO. Travel information and advice  Centers for Disease Control and Prevention (CDC): BodyEditor.hu  World Health Organization [facility]): ThirdIncome.ca Know the risks and take action to protect your health  You are at higher risk of getting coronavirus disease if you are traveling to areas with an outbreak or if you are exposed to travelers from areas with an outbreak.  Wash your hands often and practice good hygiene to lower the risk of catching or spreading the virus. What should I do if I am sick? General instructions to stop the spread of infection  Wash your hands often with soap and water for at least 20 seconds. If soap and water are not available, use alcohol-based hand sanitizer.  Cough or sneeze into a tissue, sleeve, or elbow. Do not cough or sneeze into your hand or the air.  If you cough or sneeze into a tissue, throw it away immediately and wash your hands.  Stay home unless you must get medical care. Call your health care provider or local health authority before you get medical care.  Avoid public areas. Do not take public transportation, if possible.  If you can, wear a mask if you must go out of the house or if you are in close contact with someone  who is not sick. Make sure your mask covers your nose and mouth. Keep your home clean  Disinfect objects and surfaces that are frequently touched every day. This may include: ? Counters and tables. ? Doorknobs and light switches. ? Sinks and faucets. ? Electronics such as phones, remote controls, keyboards, computers, and tablets.  Wash dishes in hot, soapy water or use a dishwasher. Air-dry your dishes.  Wash laundry in hot water. Prevent infecting other household members  Let healthy household members care for children and pets, if possible. If you have to care for children or pets, wash your hands often and wear a mask.  Sleep in a different bedroom or bed, if possible.  Do not share personal items, such as razors, toothbrushes, deodorant, combs, brushes, towels, and washcloths. Where to find more information Centers for Disease Control and Prevention (CDC)  Information and news updates: https://www.butler-gonzalez.com/ World Health Organization [facility])  Information and news updates: MissExecutive.com.ee  Coronavirus health topic: https://www.castaneda.info/  Questions and answers on COVID-19: OpportunityDebt.at  Global tracker: who.sprinklr.com American Academy of Pediatrics (AAP)  Information for families: www.healthychildren.org/English/health-issues/conditions/chest-lungs/Pages/2019-Novel-Coronavirus.aspx The coronavirus situation is changing rapidly. Check your local health authority website or the CDC and [facility] websites for updates and news. When should I contact a health care provider?  Contact your health care provider if you have symptoms of an infection, such as fever or cough, and you: ? Have been near anyone who is known to have coronavirus disease. ? Have come into contact with a person who is suspected to have coronavirus disease. ? Have traveled to an area where there is  an outbreak of  COVID-19. When should I get emergency medical care?  Get help right away by calling your local emergency services (911 in the [place] if you have: ? Trouble breathing. ? Pain or pressure in your chest. ? Confusion. ? Blue-tinged lips and fingernails. ? Difficulty waking from sleep. ? Symptoms that get worse. Let the emergency medical personnel know if you think you have coronavirus disease. Summary  A new respiratory virus is spreading from person to person and causing COVID-19 (coronavirus disease).  The virus that causes COVID-19 appears to spread easily. It spreads from one person to another through droplets from breathing, speaking, singing, coughing, or sneezing.  Older adults and those with chronic diseases are at higher risk of disease. If you are at higher risk for complications, take extra precautions.  There is currently no vaccine to prevent coronavirus disease. There are no medicines, such as antibiotics or antivirals, to treat the virus.  You can protect yourself and your family by washing your hands often, avoiding touching your face, and covering your coughs and sneezes. This information is not intended to replace advice given to you by your health care provider. Make sure you discuss any questions you have with your health care provider. Document Revised: 04/26/2019 Document Reviewed: 10/23/2018 Elsevier Patient Education  2020 [employer].  10 Things You Can Do to Manage Your COVID-19 Symptoms at Home If you have possible or confirmed COVID-19: 1. Stay home from work and school. And stay away from other public places. If you must go out, avoid using any kind of public transportation, ridesharing, or taxis. 2. Monitor your symptoms carefully. If your symptoms get worse, call your healthcare provider immediately. 3. Get rest and stay hydrated. 4. If you have a medical appointment, call the healthcare provider ahead of time and tell them that you have or may have  COVID-19. 5. For medical emergencies, call 911 and notify the dispatch personnel that you have or may have COVID-19. 6. Cover your cough and sneezes with a tissue or use the inside of your elbow. 7. Wash your hands often with soap and water for at least 20 seconds or clean your hands with an alcohol-based hand sanitizer that contains at least 60% alcohol. 8. As much as possible, stay in a specific room and away from other people in your home. Also, you should use a separate bathroom, if available. If you need to be around other people in or outside of the home, wear a mask. 9. Avoid sharing personal items with other people in your household, like dishes, towels, and bedding. 10. Clean all surfaces that are touched often, like counters, tabletops, and doorknobs. Use household cleaning sprays or wipes according to the label instructions. SouthAmericaFlowers.co.uk 01/09/2019 This information is not intended to replace advice given to you by your health care provider. Make sure you discuss any questions you have with your health care provider. Document Revised: 06/13/2019 Document Reviewed: 06/13/2019 Elsevier Patient Education  2020 Elsevier Inc.  COVID-19: Quarantine vs. Isolation QUARANTINE keeps someone who was in close contact with someone who has COVID-19 away from others. If you had close contact with a person who has COVID-19  Stay home until 14 days after your last contact.  Check your temperature twice a day and watch for symptoms of COVID-19.  If possible, stay away from people who are at higher-risk for getting very sick from COVID-19. ISOLATION keeps someone who is sick or tested positive for COVID-19 without symptoms away from others,  even in their own home. If you are sick and think or know you have COVID-19  Stay home until after ? At least 10 days since symptoms first appeared and ? At least 24 hours with no fever without fever-reducing medication and ? Symptoms have improved If  you tested positive for COVID-19 but do not have symptoms  Stay home until after ? 10 days have passed since your positive test If you live with others, stay in a specific "sick room" or area and away from other people or animals, including pets. Use a separate bathroom, if available. [email or web address] 01/28/2019 This information is not intended to replace advice given to you by your health care provider. Make sure you discuss any questions you have with your health care provider. Document Revised: 06/13/2019 Document Reviewed: 06/13/2019 Elsevier Patient Education  [place].   Prone Position Therapy  Prone position therapy, also called prone positioning, is when a person is put on his or her stomach to make breathing easier. This therapy usually takes place in a hospital intensive care unit (ICU). Most people will be asleep during the therapy, but sometimes, the person is awake. During this therapy, the person will be on his or her stomach for as long as possible. This may be for 16 or more hours each day. Often, this therapy is used when a person:  Has acute respiratory distress syndrome (ARDS). This condition is very serious because fluid collects in the lungs. When this happens, the lungs cannot fill with air like normal and oxygen cannot pass to the blood.  Requires a ventilator to help him or her breathe. A ventilator is a machine that moves air in and out of the lungs. Prone position therapy may make it easier for oxygen to pass to the blood in the lungs. It may also prevent injury to the lungs from being on a ventilator. What are the risks? The risks may include:  Tubes becoming disconnected or blocked. Examples include breathing tubes, drains, or IVs.  Pressure injuries to the skin and tissue. This happens when skin presses against a surface, such as a mattress, for too long.  Nerve injuries from being in one position for a long time.  Buildup of fluid in the  face.  Inhaling food, saliva, or liquid into the lungs (aspirating).  Not responding to this therapy and the condition gets worse instead of better. What happens before the treatment? Your health care provider will make sure it is safe to perform the therapy. To do this, your health care provider may check:  Your blood pressure, heart rate, breathing rate, and blood oxygen level.  Your skin. A protective barrier will be applied to your face and any areas that may have a lot of pressure during therapy.  Your level of alertness and pain.  Any tubes connected to you. All lines, tubes, and drains will be secured. You may be given medicines to:  Help you relax (sedatives).  Control blood pressure.  Control pain. What happens during the treatment?  Your health care providers, or a special moving bed, will lift and turn you onto your stomach.  Certain bedding will be used to help prevent pressure injuries. This may include a pillow, pad, mattress, or cushion that is filled with gel, air, water, or foam.  Your head, arms, and legs will be moved into different positions every hour.  Any medical devices and braces will be adjusted as needed to limit pressure on your skin. Your health  care providers may also place padding between the skin and the device.  Your health care providers will regularly check: ? Your skin for swelling and signs of pressure injuries. ? Your breathing tubes, drains, or IVs to make sure these are still in place and not blocked. ? Your blood to measure the levels of oxygen and carbon dioxide (arterial blood gases test). This test tells your health care provider if the therapy is helping. ? Your blood pressure, heart rate, breathing rate, and blood oxygen level. ? Your ventilator to make sure that all connections are secure and that the machine is working correctly. Ventilators have alarms that go off if something needs to be checked. The procedure may vary among  health care providers and hospitals. What can I expect after the treatment?  You will be moved from your stomach to your back.  More arterial blood gases tests will be done to make sure the treatment helped.  Your blood pressure, heart rate, breathing rate, and blood oxygen level will be monitored. Summary  Prone position therapy, also called prone positioning, is when a person is put on his or her stomach to make breathing easier.  Prone position therapy may make it easier for oxygen to pass to the blood in the lungs. It may also prevent injury to the lungs from the ventilator.  Your health care providers, or a special moving bed, will lift and turn you onto your stomach. This information is not intended to replace advice given to you by your health care provider. Make sure you discuss any questions you have with your health care provider. Document Revised: 03/15/2019 Document Reviewed: 03/15/2019 Elsevier Patient Education  2020 Elsevier Inc.    Person Under Monitoring Name: Janice Strickland  Location: [address] [place] [address]   CORONAVIRUS DISEASE 2019 (COVID-19) Guidance for Persons Under Investigation You are being tested for the virus that causes coronavirus disease 2019 (COVID-19). Public health actions are necessary to ensure protection of your health and the health of others, and to prevent further spread of infection. COVID-19 is caused by a virus that can cause symptoms, such as fever, cough, and shortness of breath. The primary transmission from person to person is by coughing or sneezing. On August 09, 2018, the World Health Organization announced a [employer] Emergency of International Concern and on August 10, 2018 the U.S. Department of Health and Human Services declared a public health emergency. If the virus that causesCOVID-19 spreads in the community, it could have severe public health consequences.  As a person under investigation for  COVID-19, the [employer] of Health and [employer] Division of [employer] advises you to adhere to the following guidance until your test results are reported to you. If your test result is positive, you will receive additional information from your provider and your local health department at that time.   Remain at home until you are cleared by your health provider or public health authorities.   Keep a log of visitors to your home using the form provided. Any visitors to your home must be aware of your isolation status.  If you plan to move to a new address or leave the county, notify the local health department in your county.  Call a doctor or seek care if you have an urgent medical need. Before seeking medical care, call ahead and get instructions from the provider before arriving at the medical office, clinic or hospital. Notify them that you  are being tested for the virus that causes COVID-19 so arrangements can be made, as necessary, to prevent transmission to others in the healthcare setting. Next, notify the local health department in your county.  If a medical emergency arises and you need to call 911, inform the first responders that you are being tested for the virus that causes COVID-19. Next, notify the local health department in your county.  Adhere to all guidance set forth by the [facility] of [employer] for [facility] of patients that is based on guidance from the Center for Disease Control and Prevention with suspected or confirmed COVID-19. It is provided with this guidance for Persons Under Investigation.  Your health and the health of our community are our top priorities. Public Health officials remain available to provide assistance and counseling to you about COVID-19 and compliance with this guidance.  Provider: ____________________________________________________________ Date: ______/_____/_________  By signing below, you acknowledge  that you have read and agree to comply with this Guidance for Persons Under Investigation. ______________________________________________________________ Date: ______/_____/_________  WHO DO I CALL? You can find a list of local health departments here: [email or web address] Health Department: ____________________________________________________________________ Contact Name: ________________________________________________________________________ Telephone: ___________________________________________________________________________  Nedra Hai, Division of Public Health, Communicable Disease Branch COVID-19 Guidance for Persons Under Investigation September 15, 2018   Person Under Monitoring Name: Janice Strickland  Location: [address] [place] [address]   Infection Prevention Recommendations for Individuals Confirmed to have, or Being Evaluated for, 2019 Novel Coronavirus (COVID-19) Infection Who Receive Care at Home  Individuals who are confirmed to have, or are being evaluated for, COVID-19 should follow the prevention steps below until a healthcare provider or local or state health department says they can return to normal activities.  Stay home except to get medical care You should restrict activities outside your home, except for getting medical care. Do not go to work, school, or public areas, and do not use public transportation or taxis.  Call ahead before visiting your doctor Before your medical appointment, call the healthcare provider and tell them that you have, or are being evaluated for, COVID-19 infection. This will help the healthcare provider's office take steps to keep other people from getting infected. Ask your healthcare provider to call the local or state health department.  Monitor your symptoms Seek prompt medical attention if your illness is worsening (e.g., difficulty breathing). Before  going to your medical appointment, call the healthcare provider and tell them that you have, or are being evaluated for, COVID-19 infection. Ask your healthcare provider to call the local or state health department.  Wear a facemask You should wear a facemask that covers your nose and mouth when you are in the same room with other people and when you visit a healthcare provider. People who live with or visit you should also wear a facemask while they are in the same room with you.  Separate yourself from other people in your home As much as possible, you should stay in a different room from other people in your home. Also, you should use a separate bathroom, if available.  Avoid sharing household items You should not share dishes, drinking glasses, cups, eating utensils, towels, bedding, or other items with other people in your home. After using these items, you should wash them thoroughly with soap and water.  Cover your coughs and sneezes Cover your mouth and nose with a tissue when you cough or sneeze, or you can cough or sneeze into  your sleeve. Throw used tissues in a lined trash can, and immediately wash your hands with soap and water for at least 20 seconds or use an alcohol-based hand rub.  Wash your [employer] your hands often and thoroughly with soap and water for at least 20 seconds. You can use an alcohol-based hand sanitizer if soap and water are not available and if your hands are not visibly dirty. Avoid touching your eyes, nose, and mouth with unwashed hands.   Prevention Steps for Caregivers and Household Members of Individuals Confirmed to have, or Being Evaluated for, COVID-19 Infection Being Cared for in the Home  If you live with, or provide care at home for, a person confirmed to have, or being evaluated for, COVID-19 infection please follow these guidelines to prevent infection:  Follow healthcare provider's instructions Make sure that you understand and can  help the patient follow any healthcare provider instructions for all care.  Provide for the patient's basic needs You should help the patient with basic needs in the home and provide support for getting groceries, prescriptions, and other personal needs.  Monitor the patient's symptoms If they are getting sicker, call his or her medical provider and tell them that the patient has, or is being evaluated for, COVID-19 infection. This will help the healthcare provider's office take steps to keep other people from getting infected. Ask the healthcare provider to call the local or state health department.  Limit the number of people who have contact with the patient  If possible, have only one caregiver for the patient.  Other household members should stay in another home or place of residence. If this is not possible, they should stay  in another room, or be separated from the patient as much as possible. Use a separate bathroom, if available.  Restrict visitors who do not have an essential need to be in the home.  Keep older adults, very young children, and other sick people away from the patient Keep older adults, very young children, and those who have compromised immune systems or chronic health conditions away from the patient. This includes people with chronic heart, lung, or kidney conditions, diabetes, and cancer.  Ensure good ventilation Make sure that shared spaces in the home have good air flow, such as from an air conditioner or an opened window, weather permitting.  Wash your hands often  Wash your hands often and thoroughly with soap and water for at least 20 seconds. You can use an alcohol based hand sanitizer if soap and water are not available and if your hands are not visibly dirty.  Avoid touching your eyes, nose, and mouth with unwashed hands.  Use disposable paper towels to dry your hands. If not available, use dedicated cloth towels and replace them when they  become wet.  Wear a facemask and gloves  Wear a disposable facemask at all times in the room and gloves when you touch or have contact with the patient's blood, body fluids, and/or secretions or excretions, such as sweat, saliva, sputum, nasal mucus, vomit, urine, or feces.  Ensure the mask fits over your nose and mouth tightly, and do not touch it during use.  Throw out disposable facemasks and gloves after using them. Do not reuse.  Wash your hands immediately after removing your facemask and gloves.  If your personal clothing becomes contaminated, carefully remove clothing and launder. Wash your hands after handling contaminated clothing.  Place all used disposable facemasks, gloves, and other waste in a  lined container before disposing them with other household waste.  Remove gloves and wash your hands immediately after handling these items.  Do not share dishes, glasses, or other household items with the patient  Avoid sharing household items. You should not share dishes, drinking glasses, cups, eating utensils, towels, bedding, or other items with a patient who is confirmed to have, or being evaluated for, COVID-19 infection.  After the person uses these items, you should wash them thoroughly with soap and water.  Wash laundry thoroughly  Immediately remove and wash clothes or bedding that have blood, body fluids, and/or secretions or excretions, such as sweat, saliva, sputum, nasal mucus, vomit, urine, or feces, on them.  Wear gloves when handling laundry from the patient.  Read and follow directions on labels of laundry or clothing items and detergent. In general, wash and dry with the warmest temperatures recommended on the label.  Clean all areas the individual has used often  Clean all touchable surfaces, such as counters, tabletops, doorknobs, bathroom fixtures, toilets, phones, keyboards, tablets, and bedside tables, every day. Also, clean any surfaces that may have  blood, body fluids, and/or secretions or excretions on them.  Wear gloves when cleaning surfaces the patient has come in contact with.  Use a diluted bleach solution (e.g., dilute bleach with 1 part bleach and 10 parts water) or a household disinfectant with a label that says EPA-registered for coronaviruses. To make a bleach solution at home, add 1 tablespoon of bleach to 1 quart (4 cups) of water. For a larger supply, add  cup of bleach to 1 gallon (16 cups) of water.  Read labels of cleaning products and follow recommendations provided on product labels. Labels contain instructions for safe and effective use of the cleaning product including precautions you should take when applying the product, such as wearing gloves or eye protection and making sure you have good ventilation during use of the product.  Remove gloves and wash hands immediately after cleaning.  Monitor yourself for signs and symptoms of illness Caregivers and household members are considered close contacts, should monitor their health, and will be asked to limit movement outside of the home to the extent possible. Follow the monitoring steps for close contacts listed on the symptom monitoring form.   ? If you have additional questions, contact your local health department or call the epidemiologist on call at [phone] (available 24/7). ? This guidance is subject to change. For the most up-to-date guidance from [facility], please refer to their website: [email or web address][employer]hu

## 2019-10-22 ENCOUNTER — Telehealth: Payer: Self-pay

## 2019-10-22 NOTE — Telephone Encounter (Signed)
Patient was recently discharged from the hospital on 10/21/19.  No TCM completed, patient does not qualify for TCM services due to [employer] coverage.  Per discharge summary patient needs follow up with PCP. HFU apt scheduled for 11/04/19 @ 3:20 PM. Lorain Childes!

## 2019-10-27 ENCOUNTER — Encounter: Payer: Self-pay | Admitting: Physician Assistant

## 2019-10-29 ENCOUNTER — Telehealth: Payer: Self-pay | Admitting: *Deleted

## 2019-10-29 ENCOUNTER — Ambulatory Visit: Payer: Self-pay

## 2019-10-29 NOTE — Telephone Encounter (Signed)
Call to patient as follow up to assigned MyChart- companion. Patient is doing better- she states she still gets out of breath at times. She does have an appointment with PCP next week. Patient advised she can use the questionnaire to monitor symptoms and make sure she is trending toward improvement.

## 2019-10-29 NOTE — Progress Notes (Signed)
Established patient visit    [name]   DOB: 1971/05/13   49 y.o. Female  MRN: [id number] Visit Date: 11/04/2019  Today's healthcare provider: Trey Sailors, PA-C   Chief Complaint  Patient presents with  . Hospitalization Follow-up   Subjective    HPI Follow up Hospitalization  Patient was admitted to [facility] on 10/17/2019 and discharged on 10/21/2019. She was treated for pneumonia due to COVID-19 virus. Treatment for this included:labs, oxygen, EKG, IV w/ Ceftriaxone and decadron. Telephone follow up was done on 10/22/2019. She reports good compliance with treatment. She reports this condition is resolved.  She is feeling about 80% better. She still has trouble with longer distances and SOB. She has RA and is scheduling with Dr. Alean Rinne. Dermatologist has placed her on hydroxychloroquine for rheumatoid arthritis.   She held her estrogen during hospitalization due to risk of blood clots. She gets this from Dr. Bonney Aid. She has since started this medication again.     ----------------------------------------------------------------------------------------- -      Medications: Outpatient Medications Prior to Visit  Medication Sig  . vitamin C (ASCORBIC ACID) 250 MG tablet Take 1 tablet (250 mg total) by mouth daily.  Marland Kitchen zinc sulfate 220 (50 Zn) MG capsule Take 1 capsule (220 mg total) by mouth 2 (two) times daily.   No facility-administered medications prior to visit.    Review of Systems  Constitutional: Positive for fatigue. Negative for chills and fever.  HENT: Negative for congestion, postnasal drip, rhinorrhea, sinus pressure, sinus pain, sneezing and sore throat.   Respiratory: Negative for cough, chest tightness, shortness of breath and wheezing.   Cardiovascular: Negative for chest pain.  Gastrointestinal: Negative for constipation, diarrhea, nausea and vomiting.  Neurological: Negative for weakness.       Objective    BP 118/72  (BP Location: Left Arm, Patient Position: Sitting, Cuff Size: Normal)   Temp (!) 97.3 F (36.3 C) (Temporal)   Wt 168 lb 6.4 oz (76.4 kg)   SpO2 95%   BMI 28.02 kg/m    Physical Exam Constitutional:      Appearance: Normal appearance.  Cardiovascular:     Rate and Rhythm: Normal rate and regular rhythm.     Heart sounds: Normal heart sounds.  Pulmonary:     Effort: Pulmonary effort is normal.     Breath sounds: Normal breath sounds.  Skin:    General: Skin is warm and dry.  Neurological:     Mental Status: She is alert and oriented to person, place, and time. Mental status is at baseline.  Psychiatric:        Mood and Affect: Mood normal.        Behavior: Behavior normal.       No results found for any visits on 11/04/19.   Assessment & Plan    1. Pneumonia due to COVID-19 virus  Doing better clinically. Continue to observe. Ordered CXR to get one month after her initial image on 10/17/2019.  - DG Chest 2 View; Future   No follow-ups on file.      ITrey Sailors, PA-C, have reviewed all documentation for this visit. The documentation on 11/05/19 for the exam, diagnosis, procedures, and orders are all accurate and complete.  The entirety of the information documented in the History of Present Illness, Review of Systems and Physical Exam were personally obtained by me. Portions of this information were initially documented by [facility] and reviewed by me for  thoroughness and accuracy.     Paulene Floor  [facility] [phone] (phone) [phone] (fax)  [place]

## 2019-10-31 ENCOUNTER — Other Ambulatory Visit: Payer: Self-pay | Admitting: Internal Medicine

## 2019-10-31 DIAGNOSIS — J1282 Pneumonia due to coronavirus disease 2019: Secondary | ICD-10-CM

## 2019-10-31 DIAGNOSIS — L308 Other specified dermatitis: Secondary | ICD-10-CM | POA: Diagnosis not present

## 2019-11-04 ENCOUNTER — Encounter: Payer: Self-pay | Admitting: Physician Assistant

## 2019-11-04 ENCOUNTER — Other Ambulatory Visit: Payer: Self-pay

## 2019-11-04 ENCOUNTER — Ambulatory Visit (INDEPENDENT_AMBULATORY_CARE_PROVIDER_SITE_OTHER): Payer: BC Managed Care – PPO | Admitting: Physician Assistant

## 2019-11-04 VITALS — BP 118/72 | Temp 97.3°F | Wt 168.4 lb

## 2019-11-04 DIAGNOSIS — U071 COVID-19: Secondary | ICD-10-CM | POA: Diagnosis not present

## 2019-11-04 DIAGNOSIS — J1282 Pneumonia due to coronavirus disease 2019: Secondary | ICD-10-CM | POA: Diagnosis not present

## 2019-11-04 NOTE — Patient Instructions (Addendum)
Plan on getting chest xray at the outpatient imaging center on May 8 or later.    COVID-19 COVID-19 is a respiratory infection that is caused by a virus called severe acute respiratory syndrome coronavirus 2 (SARS-CoV-2). The disease is also known as coronavirus disease or novel coronavirus. In some people, the virus may not cause any symptoms. In others, it may cause a serious infection. The infection can get worse quickly and can lead to complications, such as:  Pneumonia, or infection of the lungs.  Acute respiratory distress syndrome or ARDS. This is a condition in which fluid build-up in the lungs prevents the lungs from filling with air and passing oxygen into the blood.  Acute respiratory failure. This is a condition in which there is not enough oxygen passing from the lungs to the body or when carbon dioxide is not passing from the lungs out of the body.  Sepsis or septic shock. This is a serious bodily reaction to an infection.  Blood clotting problems.  Secondary infections due to bacteria or fungus.  Organ failure. This is when your body's organs stop working. The virus that causes COVID-19 is contagious. This means that it can spread from person to person through droplets from coughs and sneezes (respiratory secretions). What are the causes? This illness is caused by a virus. You may catch the virus by:  Breathing in droplets from an infected person. Droplets can be spread by a person breathing, speaking, singing, coughing, or sneezing.  Touching something, like a table or a doorknob, that was exposed to the virus (contaminated) and then touching your mouth, nose, or eyes. What increases the risk? Risk for infection You are more likely to be infected with this virus if you:  Are within 6 feet (2 meters) of a person with COVID-19.  Provide care for or live with a person who is infected with COVID-19.  Spend time in crowded indoor spaces or live in shared housing. Risk  for serious illness You are more likely to become seriously ill from the virus if you:  Are 71 years of age or older. The higher your age, the more you are at risk for serious illness.  Live in a nursing home or long-term care facility.  Have cancer.  Have a long-term (chronic) disease such as: ? Chronic lung disease, including chronic obstructive pulmonary disease or asthma. ? A long-term disease that lowers your body's ability to fight infection (immunocompromised). ? Heart disease, including heart failure, a condition in which the arteries that lead to the heart become narrow or blocked (coronary artery disease), a disease which makes the heart muscle thick, weak, or stiff (cardiomyopathy). ? Diabetes. ? Chronic kidney disease. ? Sickle cell disease, a condition in which red blood cells have an abnormal "sickle" shape. ? Liver disease.  Are obese. What are the signs or symptoms? Symptoms of this condition can range from mild to severe. Symptoms may appear any time from 2 to 14 days after being exposed to the virus. They include:  A fever or chills.  A cough.  Difficulty breathing.  Headaches, body aches, or muscle aches.  Runny or stuffy (congested) nose.  A sore throat.  New loss of taste or smell. Some people may also have stomach problems, such as nausea, vomiting, or diarrhea. Other people may not have any symptoms of COVID-19. How is this diagnosed? This condition may be diagnosed based on:  Your signs and symptoms, especially if: ? You live in an area with a  COVID-19 outbreak. ? You recently traveled to or from an area where the virus is common. ? You provide care for or live with a person who was diagnosed with COVID-19. ? You were exposed to a person who was diagnosed with COVID-19.  A physical exam.  Lab tests, which may include: ? Taking a sample of fluid from the back of your nose and throat (nasopharyngeal fluid), your nose, or your throat using a  swab. ? A sample of mucus from your lungs (sputum). ? Blood tests.  Imaging tests, which may include, X-rays, CT scan, or ultrasound. How is this treated? At present, there is no medicine to treat COVID-19. Medicines that treat other diseases are being used on a trial basis to see if they are effective against COVID-19. Your health care provider will talk with you about ways to treat your symptoms. For most people, the infection is mild and can be managed at home with rest, fluids, and over-the-counter medicines. Treatment for a serious infection usually takes places in a hospital intensive care unit (ICU). It may include one or more of the following treatments. These treatments are given until your symptoms improve.  Receiving fluids and medicines through an IV.  Supplemental oxygen. Extra oxygen is given through a tube in the nose, a face mask, or a hood.  Positioning you to lie on your stomach (prone position). This makes it easier for oxygen to get into the lungs.  Continuous positive airway pressure (CPAP) or bi-level positive airway pressure (BPAP) machine. This treatment uses mild air pressure to keep the airways open. A tube that is connected to a motor delivers oxygen to the body.  Ventilator. This treatment moves air into and out of the lungs by using a tube that is placed in your windpipe.  Tracheostomy. This is a procedure to create a hole in the neck so that a breathing tube can be inserted.  Extracorporeal membrane oxygenation (ECMO). This procedure gives the lungs a chance to recover by taking over the functions of the heart and lungs. It supplies oxygen to the body and removes carbon dioxide. Follow these instructions at home: Lifestyle  If you are sick, stay home except to get medical care. Your health care provider will tell you how long to stay home. Call your health care provider before you go for medical care.  Rest at home as told by your health care provider.  Do  not use any products that contain nicotine or tobacco, such as cigarettes, e-cigarettes, and chewing tobacco. If you need help quitting, ask your health care provider.  Return to your normal activities as told by your health care provider. Ask your health care provider what activities are safe for you. General instructions  Take over-the-counter and prescription medicines only as told by your health care provider.  Drink enough fluid to keep your urine pale yellow.  Keep all follow-up visits as told by your health care provider. This is important. How is this prevented?  There is no vaccine to help prevent COVID-19 infection. However, there are steps you can take to protect yourself and others from this virus. To protect yourself:   Do not travel to areas where COVID-19 is a risk. The areas where COVID-19 is reported change often. To identify high-risk areas and travel restrictions, check the CDC travel website: StageSync.si  If you live in, or must travel to, an area where COVID-19 is a risk, take precautions to avoid infection. ? Stay away from people who  are sick. ? Wash your hands often with soap and water for 20 seconds. If soap and water are not available, use an alcohol-based hand sanitizer. ? Avoid touching your mouth, face, eyes, or nose. ? Avoid going out in public, follow guidance from your state and local health authorities. ? If you must go out in public, wear a cloth face covering or face mask. Make sure your mask covers your nose and mouth. ? Avoid crowded indoor spaces. Stay at least 6 feet (2 meters) away from others. ? Disinfect objects and surfaces that are frequently touched every day. This may include:  Counters and tables.  Doorknobs and light switches.  Sinks and faucets.  Electronics, such as phones, remote controls, keyboards, computers, and tablets. To protect others: If you have symptoms of COVID-19, take steps to prevent the virus from  spreading to others.  If you think you have a COVID-19 infection, contact your health care provider right away. Tell your health care team that you think you may have a COVID-19 infection.  Stay home. Leave your house only to seek medical care. Do not use public transport.  Do not travel while you are sick.  Wash your hands often with soap and water for 20 seconds. If soap and water are not available, use alcohol-based hand sanitizer.  Stay away from other members of your household. Let healthy household members care for children and pets, if possible. If you have to care for children or pets, wash your hands often and wear a mask. If possible, stay in your own room, separate from others. Use a different bathroom.  Make sure that all people in your household wash their hands well and often.  Cough or sneeze into a tissue or your sleeve or elbow. Do not cough or sneeze into your hand or into the air.  Wear a cloth face covering or face mask. Make sure your mask covers your nose and mouth. Where to find more information  Centers for Disease Control and Prevention: PurpleGadgets.be  World Health Organization: https://www.castaneda.info/ Contact a health care provider if:  You live in or have traveled to an area where COVID-19 is a risk and you have symptoms of the infection.  You have had contact with someone who has COVID-19 and you have symptoms of the infection. Get help right away if:  You have trouble breathing.  You have pain or pressure in your chest.  You have confusion.  You have bluish lips and fingernails.  You have difficulty waking from sleep.  You have symptoms that get worse. These symptoms may represent a serious problem that is an emergency. Do not wait to see if the symptoms will go away. Get medical help right away. Call your local emergency services (911 in the [place] Do not drive yourself to the hospital. Let the  emergency medical personnel know if you think you have COVID-19. Summary  COVID-19 is a respiratory infection that is caused by a virus. It is also known as coronavirus disease or novel coronavirus. It can cause serious infections, such as pneumonia, acute respiratory distress syndrome, acute respiratory failure, or sepsis.  The virus that causes COVID-19 is contagious. This means that it can spread from person to person through droplets from breathing, speaking, singing, coughing, or sneezing.  You are more likely to develop a serious illness if you are 76 years of age or older, have a weak immune system, live in a nursing home, or have chronic disease.  There is no  medicine to treat COVID-19. Your health care provider will talk with you about ways to treat your symptoms.  Take steps to protect yourself and others from infection. Wash your hands often and disinfect objects and surfaces that are frequently touched every day. Stay away from people who are sick and wear a mask if you are sick. This information is not intended to replace advice given to you by your health care provider. Make sure you discuss any questions you have with your health care provider. Document Revised: 04/26/2019 Document Reviewed: 08/02/2018 Elsevier Patient Education  2020 [employer].

## 2019-11-05 ENCOUNTER — Ambulatory Visit: Payer: BC Managed Care – PPO | Attending: Internal Medicine

## 2019-11-05 DIAGNOSIS — Z23 Encounter for immunization: Secondary | ICD-10-CM

## 2019-11-05 NOTE — Progress Notes (Signed)
   Covid-19 Vaccination Clinic  Name:  Janice Strickland    MRN: [id number] DOB: [birth year]  11/05/2019  Ms. [name] was observed post Covid-19 immunization for 15 minutes without incident. She was provided with Vaccine Information Sheet and instruction to access the V-Safe system.   Ms. [name] was instructed to call 911 with any severe reactions post vaccine: Marland Kitchen Difficulty breathing  . Swelling of face and throat  . A fast heartbeat  . A bad rash all over body  . Dizziness and weakness   Immunizations Administered    Name Date Dose VIS Date Route   Pfizer COVID-19 Vaccine 11/05/2019 10:38 AM 0.3 mL 09/04/2018 Intramuscular   Manufacturer: [employer], [employer]   Lot: [id number]   NDC: [address]-9

## 2019-11-11 ENCOUNTER — Ambulatory Visit: Payer: Commercial Managed Care - PPO | Admitting: Dermatology

## 2019-11-14 ENCOUNTER — Encounter: Payer: Self-pay | Admitting: Physician Assistant

## 2019-11-18 ENCOUNTER — Ambulatory Visit
Admission: RE | Admit: 2019-11-18 | Discharge: 2019-11-18 | Disposition: A | Discharge status: Home / Self Care | Payer: BC Managed Care – PPO | Source: Ambulatory Visit | Attending: Physician Assistant | Admitting: Physician Assistant

## 2019-11-18 ENCOUNTER — Other Ambulatory Visit: Payer: Self-pay

## 2019-11-18 ENCOUNTER — Ambulatory Visit
Admission: RE | Admit: 2019-11-18 | Discharge: 2019-11-18 | Disposition: A | Discharge status: Home / Self Care | Payer: BC Managed Care – PPO | Attending: Physician Assistant | Admitting: Physician Assistant

## 2019-11-18 DIAGNOSIS — U071 COVID-19: Secondary | ICD-10-CM | POA: Diagnosis not present

## 2019-11-18 DIAGNOSIS — J1282 Pneumonia due to coronavirus disease 2019: Secondary | ICD-10-CM | POA: Diagnosis not present

## 2019-11-18 DIAGNOSIS — J189 Pneumonia, unspecified organism: Secondary | ICD-10-CM | POA: Diagnosis not present

## 2019-11-18 DIAGNOSIS — R079 Chest pain, unspecified: Secondary | ICD-10-CM | POA: Diagnosis not present

## 2019-11-19 ENCOUNTER — Encounter: Payer: Self-pay | Admitting: Physician Assistant

## 2019-11-19 ENCOUNTER — Other Ambulatory Visit: Payer: Self-pay | Admitting: Internal Medicine

## 2019-11-19 DIAGNOSIS — Z8781 Personal history of (healed) traumatic fracture: Secondary | ICD-10-CM

## 2019-11-19 DIAGNOSIS — Z13820 Encounter for screening for osteoporosis: Secondary | ICD-10-CM

## 2019-11-26 ENCOUNTER — Telehealth: Payer: BC Managed Care – PPO | Admitting: Physician Assistant

## 2019-11-26 DIAGNOSIS — L255 Unspecified contact dermatitis due to plants, except food: Secondary | ICD-10-CM

## 2019-11-26 MED ORDER — PREDNISONE 10 MG PO TABS: ORAL_TABLET | ORAL | 0 refills | Status: DC

## 2019-11-26 NOTE — Progress Notes (Signed)

## 2019-11-27 DIAGNOSIS — L308 Other specified dermatitis: Secondary | ICD-10-CM | POA: Diagnosis not present

## 2019-11-27 DIAGNOSIS — M791 Myalgia, unspecified site: Secondary | ICD-10-CM | POA: Diagnosis not present

## 2019-11-27 DIAGNOSIS — R5383 Other fatigue: Secondary | ICD-10-CM | POA: Diagnosis not present

## 2019-11-27 DIAGNOSIS — M2550 Pain in unspecified joint: Secondary | ICD-10-CM | POA: Diagnosis not present

## 2019-11-27 DIAGNOSIS — R29898 Other symptoms and signs involving the musculoskeletal system: Secondary | ICD-10-CM | POA: Diagnosis not present

## 2019-12-10 ENCOUNTER — Encounter: Payer: Self-pay | Admitting: Physician Assistant

## 2019-12-10 DIAGNOSIS — Z8781 Personal history of (healed) traumatic fracture: Secondary | ICD-10-CM

## 2019-12-18 ENCOUNTER — Encounter: Payer: Self-pay | Admitting: Neurology

## 2019-12-18 DIAGNOSIS — R29898 Other symptoms and signs involving the musculoskeletal system: Secondary | ICD-10-CM | POA: Diagnosis not present

## 2019-12-18 DIAGNOSIS — M2550 Pain in unspecified joint: Secondary | ICD-10-CM | POA: Diagnosis not present

## 2019-12-18 DIAGNOSIS — R202 Paresthesia of skin: Secondary | ICD-10-CM | POA: Diagnosis not present

## 2019-12-18 DIAGNOSIS — L308 Other specified dermatitis: Secondary | ICD-10-CM | POA: Diagnosis not present

## 2019-12-18 NOTE — Progress Notes (Signed)
I,Roshena L Chambers,acting as a scribe for Trey Sailors, PA-C.,have documented all relevant documentation on the behalf of Trey Sailors, PA-C,as directed by  Trey Sailors, PA-C while in the presence of Trey Sailors, PA-C.  Established patient visit   [name]   DOB: May 13, 1971   49 y.o. Female  MRN: [id number] Visit Date: 12/19/2019  Today's healthcare provider: Trey Sailors, PA-C   Chief Complaint  Patient presents with  . Abnormal Lab   Subjective    HPI Abnormal lab: Patient comes in reporting that her Rheumatologist at [facility] ordered labs within the last month which showed low T3 levels of 18. Patient was advised to follow up with her PCP. Her labs were negative for Rheumatoid arthritis and patient was referred to Neurology. Patient states [facility] is not able to see for for several months. She has has some weakness in her legs and fatigue. Patient denies any problems with her thyroid in the past. She is not currently taking any supplements or vitamins.   Laceration of left thumb: Patient cut her left thumb last night with a knife while peeling sweet potatoes. She sustained a laceration of the left thumb. She has been keeping the would covered with a bandage. There is still bleeding to the would.  Her last Tdap was 04/16/2018.       Medications: Outpatient Medications Prior to Visit  Medication Sig  . estradiol (ESTRACE) 1 MG tablet Take 1 mg by mouth daily.  . hydroxychloroquine (PLAQUENIL) 200 MG tablet Take 200 mg by mouth daily.  . [DISCONTINUED] predniSONE (DELTASONE) 10 MG tablet Take 6 tabs x 2 days, then 5 tabs x 2 days, then 4 tabs x 2 days, then 3 tabs x 2 days, then 2 tabs x 2 days, then 1 tab x 2 days.  . [DISCONTINUED] vitamin C (ASCORBIC ACID) 250 MG tablet Take 1 tablet (250 mg total) by mouth daily.  . [DISCONTINUED] zinc sulfate 220 (50 Zn) MG capsule Take 1 capsule (220 mg total) by mouth 2  (two) times daily.   No facility-administered medications prior to visit.    Review of Systems  Constitutional: Positive for fatigue. Negative for appetite change, chills and fever.  Respiratory: Positive for shortness of breath (with deep breathing). Negative for chest tightness.   Cardiovascular: Negative for chest pain and palpitations.  Gastrointestinal: Negative for abdominal pain, nausea and vomiting.  Neurological: Positive for weakness (in legs) and numbness (in legs). Negative for dizziness.      Objective    BP 100/70 (BP Location: Left Arm, Patient Position: Sitting, Cuff Size: Large)   Pulse (!) 106   Temp (!) 97.1 F (36.2 C) (Temporal)   Resp 16   Wt 172 lb (78 kg)   SpO2 97% Comment: room air  BMI 28.62 kg/m    Physical Exam Constitutional:      Appearance: Normal appearance.  Neck:     Thyroid: No thyroid mass, thyromegaly or thyroid tenderness.  Cardiovascular:     Rate and Rhythm: Normal rate and regular rhythm.     Heart sounds: Normal heart sounds.  Pulmonary:     Effort: Pulmonary effort is normal.     Breath sounds: Normal breath sounds.  Skin:    General: Skin is warm and dry.     Findings: Laceration (left thumb) and wound present.  Neurological:     Mental Status: She is alert and oriented to person, place, and  time. Mental status is at baseline.  Psychiatric:        Mood and Affect: Mood normal.        Behavior: Behavior normal.       No results found for any visits on 12/19/19.  Assessment & Plan     1. Paresthesia of bilateral legs Patient has an appointment with [facility] on 03/30/2020 but wants to be seen sooner. Will refer to [facility] and see if they are able to seen her at an earlier date. - Ambulatory referral to Neurology  2. Laceration of left thumb without foreign body without damage to nail, initial encounter -Wound irrigated with sterile saline and the sealed with Dermabond. Patient advised to call back  if she devlopes any signs of infection. Tetanus UTD.   3. T3 low in serum -Her TSH was normal. Patient will call back if she wants referral to Endocrinology for evaluation of low T3 levels.    I have spent 25 minutes with this patient, >50% of which was spent on counseling and coordination of care.          Return if symptoms worsen or fail to improve.      ITrinna Post, PA-C, have reviewed all documentation for this visit. The documentation on 12/19/19 for the exam, diagnosis, procedures, and orders are all accurate and complete.    Paulene Floor  [facility] [phone] (phone) [phone] (fax)  [place]

## 2019-12-19 ENCOUNTER — Other Ambulatory Visit: Payer: Self-pay

## 2019-12-19 ENCOUNTER — Encounter: Payer: Self-pay | Admitting: Physician Assistant

## 2019-12-19 ENCOUNTER — Ambulatory Visit (INDEPENDENT_AMBULATORY_CARE_PROVIDER_SITE_OTHER): Payer: BC Managed Care – PPO | Admitting: Physician Assistant

## 2019-12-19 ENCOUNTER — Telehealth: Payer: Self-pay | Admitting: Physician Assistant

## 2019-12-19 VITALS — BP 100/70 | HR 106 | Temp 97.1°F | Resp 16 | Wt 172.0 lb

## 2019-12-19 DIAGNOSIS — S61012A Laceration without foreign body of left thumb without damage to nail, initial encounter: Secondary | ICD-10-CM

## 2019-12-19 DIAGNOSIS — R202 Paresthesia of skin: Secondary | ICD-10-CM

## 2019-12-19 DIAGNOSIS — R7989 Other specified abnormal findings of blood chemistry: Secondary | ICD-10-CM

## 2019-12-19 NOTE — Telephone Encounter (Signed)
Hi Janice Strickland,   I've placed two orders for a bone density in May and again in June and patient was never contacted. Can we look into this? Thank you!

## 2020-01-14 ENCOUNTER — Other Ambulatory Visit: Payer: Self-pay | Admitting: Obstetrics and Gynecology

## 2020-01-14 ENCOUNTER — Telehealth: Payer: Self-pay

## 2020-01-14 NOTE — Telephone Encounter (Signed)
Pt calling; needs order for mammogram sent in.  [phone]  Left detailed msg AMS not in office this week; order should be put in next week.  If needs order before then to call me back.

## 2020-02-11 ENCOUNTER — Ambulatory Visit
Admission: RE | Admit: 2020-02-11 | Discharge: 2020-02-11 | Disposition: A | Discharge status: Home / Self Care | Payer: BC Managed Care – PPO | Source: Ambulatory Visit | Attending: Physician Assistant | Admitting: Physician Assistant

## 2020-02-11 ENCOUNTER — Other Ambulatory Visit: Payer: Self-pay

## 2020-02-11 DIAGNOSIS — Z8781 Personal history of (healed) traumatic fracture: Secondary | ICD-10-CM | POA: Diagnosis not present

## 2020-02-11 DIAGNOSIS — Z780 Asymptomatic menopausal state: Secondary | ICD-10-CM | POA: Diagnosis not present

## 2020-02-11 DIAGNOSIS — Z13820 Encounter for screening for osteoporosis: Secondary | ICD-10-CM | POA: Diagnosis not present

## 2020-02-13 ENCOUNTER — Encounter: Payer: Self-pay | Admitting: Neurology

## 2020-02-13 ENCOUNTER — Ambulatory Visit (INDEPENDENT_AMBULATORY_CARE_PROVIDER_SITE_OTHER): Payer: BC Managed Care – PPO | Admitting: Neurology

## 2020-02-13 VITALS — BP 102/72 | HR 78 | Ht 65.0 in | Wt 173.5 lb

## 2020-02-13 DIAGNOSIS — R202 Paresthesia of skin: Secondary | ICD-10-CM | POA: Diagnosis not present

## 2020-02-13 DIAGNOSIS — R531 Weakness: Secondary | ICD-10-CM | POA: Diagnosis not present

## 2020-02-13 MED ORDER — DULOXETINE HCL 30 MG PO CPEP: 30.0000 mg | ORAL_CAPSULE | Freq: Every day | ORAL | 5 refills | Status: DC

## 2020-02-13 NOTE — Progress Notes (Signed)
HISTORICAL  PATRISIA Strickland, is a 49 year old female, seen in request by her primary care PA Osvaldo Angst for evaluation of worsening intermittent weakness, paresthesia following her COVID-19 infection, initial evaluation was on February 13, 2020  I reviewed and summarized the referring note.  She suffered COVID-19 infection her hospital admission on April 8-12, 2021, she presented with high fever, 103, nausea vomiting, diarrhea, profound fatigue, body aches, progressive shortness of breath, SPO2 80s in room air, chest x-ray confirmed COVID-19 pneumonia, she was treated with remdesivir, steroid, metered-dose inhaler,  Her symptoms lingered for few weeks, gradually improved,  Over the past 10 years, she already experience intermittent muscle weakness, [place] farm, with a lot of farm animals, picking on 50 pounds animal feeding regularly, over the past 10 years, she had episode of intermittent weakness involving her legs, arms, she has to hold on handrail to get up stairs, intermittent numbness tingling, shocking sensation involving her legs, arms, sometimes even her face, symptoms usually last for few days, never has prolonged persistent functional limitations symptoms  However since her Covid infection, she had a persistent weakness, it is very hard for her to pick up the feeding back of 50 pounds, also complains of frequent bilateral frontal vertex area pressure headaches, urinary urgency, occasionally incontinence, blurry vision, word finding difficulties, mental claudications, clumsiness of hand with handwriting, pain chronic area when bearing weight  She was diagnosed with autoimmune disease following rash broke out in early 2021, is seeing rheumatologist, taking Plaquenil  Laboratory evaluation in May 2021: Elevated D-dimer D-dimer 527, C-reactive protein mildly elevated at 1.2, normal CBC no significant abnormality on CMP, negative prolactin    REVIEW OF SYSTEMS: Full 14 system  review of systems performed and notable only for as above All other review of systems were negative.  ALLERGIES: No Known Allergies  HOME MEDICATIONS: Current Outpatient Medications  Medication Sig Dispense Refill  . estradiol (ESTRACE) 1 MG tablet Take 1 mg by mouth daily.    . hydroxychloroquine (PLAQUENIL) 200 MG tablet Take 200-400 mg by mouth daily. Alternates 200mg  with 400mg  every other day.     No current facility-administered medications for this visit.    PAST MEDICAL HISTORY: Past Medical History:  Diagnosis Date  . Motion sickness    back seat - cars  . Numbness and tingling   . Trochanteric bursitis of left hip   . Vertigo    during seasonal allergy season    PAST SURGICAL HISTORY: Past Surgical History:  Procedure Laterality Date  . ABDOMINAL HYSTERECTOMY  2003  . bunion removal  06/28/2018   right foot  . BUNIONECTOMY    . COLONOSCOPY WITH PROPOFOL N/A 05/08/2019   Procedure: COLONOSCOPY WITH PROPOFOL;  Surgeon: 06/30/2018, MD;  Location: [facility];  Service: Endoscopy;  Laterality: N/A;  . TONSILLECTOMY Bilateral 05/24/2019   Procedure: TONSILLECTOMY;  Surgeon: Pasty Spillers, MD;  Location: [facility];  Service: ENT;  Laterality: Bilateral;  . TONSILLECTOMY      FAMILY HISTORY: Family History  Problem Relation Age of Onset  . Hypertension Mother   . Hypothyroidism Mother   . Heart disease Mother   . Healthy Father   . Hypertension Brother   . Breast cancer Neg Hx     SOCIAL HISTORY: Social History   Socioeconomic History  . Marital status: Married    Spouse name: Not on file  . Number of children: 3  . Years of education: 65  . Highest education  level: High school graduate  Occupational History  . 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 Smoking status: Never Smoker  . Smokeless tobacco: Never Used  Vaping Use  . Vaping Use: Never used  Substance and Sexual Activity  . Alcohol use: Yes     Comment: may have a drink on Holidays  . Drug use: Never  . Sexual activity: Yes    Birth control/protection: Surgical    Comment: Hysterectomy  Other Topics Concern  . Not on file  Social History Narrative   Lives at home with family.   Right-handed.   At most, one cup caffeine daily.   Social Determinants of Health   Financial Resource Strain:   . Difficulty of Paying Living Expenses:   Food Insecurity:   . Worried About Programme researcher, broadcasting/film/video in the Last Year:   . Barista in the Last Year:   Transportation Needs:   . Freight forwarder (Medical):   Marland Kitchen Lack of Transportation (Non-Medical):   Physical Activity:   . Days of Exercise per Week:   . Minutes of Exercise per Session:   Stress:   . Feeling of Stress :   Social Connections:   . Frequency of Communication with Friends and Family:   . Frequency of Social Gatherings with Friends and Family:   . Attends Religious Services:   . Active Member of Clubs or Organizations:   . Attends Banker Meetings:   Marland Kitchen Marital Status:   Intimate Partner Violence:   . Fear of Current or Ex-Partner:   . Emotionally Abused:   Marland Kitchen Physically Abused:   . Sexually Abused:      PHYSICAL EXAM   Vitals:   02/13/20 0812  BP: 102/72  Pulse: 78  Weight: 173 lb 8 oz (78.7 kg)  Height: 5\' 5"  (1.651 m)   Not recorded     Body mass index is 28.87 kg/m.  PHYSICAL EXAMNIATION:  Gen: NAD, conversant, well nourised, well groomed                     Cardiovascular: Regular rate rhythm, no peripheral edema, warm, nontender. Eyes: Conjunctivae clear without exudates or hemorrhage Neck: Supple, no carotid bruits. Pulmonary: Clear to auscultation bilaterally   NEUROLOGICAL EXAM:  MENTAL STATUS: Speech:    Speech is normal; fluent and spontaneous with normal comprehension.  Cognition:     Orientation to time, place and person     Normal recent and remote memory     Normal Attention span and concentration      Normal Language, naming, repeating,spontaneous speech     Fund of knowledge   CRANIAL NERVES: CN II: Visual fields are full to confrontation. Pupils are round equal and briskly reactive to light. CN III, IV, VI: extraocular movement are normal. No ptosis. CN V: Facial sensation is intact to light touch CN VII: Face is symmetric with normal eye closure  CN VIII: Hearing is normal to causal conversation. CN IX, X: Phonation is normal. CN XI: Head turning and shoulder shrug are intact  MOTOR: There is no pronator drift of out-stretched arms. Muscle bulk and tone are normal. Muscle strength is normal.  REFLEXES: Reflexes are 2+ and symmetric at the biceps, triceps, knees, and ankles. Plantar responses are flexor.  SENSORY: Intact to light touch, pinprick and vibratory sensation are intact in fingers and toes.  COORDINATION: There is no trunk or limb dysmetria noted.  GAIT/STANCE: Posture  is normal. Gait is steady with normal steps, base, arm swing, and turning. Heel and toe walking are normal. Tandem gait is normal.  Romberg is absent.   DIAGNOSTIC DATA (LABS, IMAGING, TESTING) - I reviewed patient records, labs, notes, testing and imaging myself where available.   ASSESSMENT AND PLAN  DOSHIE MAGGI is a 49 y.o. female   Constellation of complaints, intermittent upper and lower extremity paresthesia, weakness, urinary urgency, blurry vision, fatigue, unsteady sensation  Essentially normal neurological examination, but with her reported recurrent episodic functional limiting symptoms,  Need to rule out central nervous system structural lesion, MRI of the brain, cervical spine  Try low-dose Cymbalta 30 mg daily   Levert Feinstein, M.D. Ph.D.  [facility] [address] [place], [place] [address] Ph: [phone] Fax: [phone]  CC:  Trey Sailors, PA-C [address] [place],  [place] [address]

## 2020-02-19 ENCOUNTER — Telehealth: Payer: Self-pay | Admitting: Neurology

## 2020-02-19 NOTE — Telephone Encounter (Signed)
[employer] pending faxed notes to RAD MD

## 2020-02-20 NOTE — Telephone Encounter (Signed)
Patient returned my call she is scheduled at [facility] for 02/25/20. 

## 2020-02-20 NOTE — Telephone Encounter (Signed)
[employer] RAD MD Auth: [id number] & [phone] (exp. 02/19/20 to 03/20/20)   I left a voicemail for the patient to call back about scheduling MRI's,.

## 2020-02-25 ENCOUNTER — Ambulatory Visit: Payer: BC Managed Care – PPO

## 2020-02-25 DIAGNOSIS — R202 Paresthesia of skin: Secondary | ICD-10-CM

## 2020-02-25 DIAGNOSIS — R531 Weakness: Secondary | ICD-10-CM

## 2020-02-27 ENCOUNTER — Telehealth: Payer: Self-pay | Admitting: Neurology

## 2020-02-27 NOTE — Telephone Encounter (Signed)
I called the patient, MRI of the brain and cervical spine were unremarkable.   MRI cervical 02/26/20:  IMPRESSION:   Normal MRI cervical spine (without).   MRI brain 02/26/20:   IMPRESSION:   Normal MRI brain (without).

## 2020-02-27 NOTE — Telephone Encounter (Signed)
-----   Message from Lilla Shook, RN sent at 02/27/2020 10:24 AM EDT -----  ----- Message ----- From: Suanne Marker, MD Sent: 02/27/2020   9:15 AM EDT To: Levert Feinstein, MD

## 2020-03-03 ENCOUNTER — Other Ambulatory Visit: Payer: Self-pay | Admitting: Obstetrics and Gynecology

## 2020-03-03 DIAGNOSIS — Z1231 Encounter for screening mammogram for malignant neoplasm of breast: Secondary | ICD-10-CM

## 2020-03-06 ENCOUNTER — Other Ambulatory Visit: Payer: Self-pay

## 2020-03-06 ENCOUNTER — Ambulatory Visit
Admission: RE | Admit: 2020-03-06 | Discharge: 2020-03-06 | Disposition: A | Discharge status: Home / Self Care | Payer: BC Managed Care – PPO | Source: Ambulatory Visit | Attending: Obstetrics and Gynecology | Admitting: Obstetrics and Gynecology

## 2020-03-06 DIAGNOSIS — Z1231 Encounter for screening mammogram for malignant neoplasm of breast: Secondary | ICD-10-CM | POA: Diagnosis not present

## 2020-03-07 ENCOUNTER — Other Ambulatory Visit: Payer: Self-pay | Admitting: Neurology

## 2020-03-09 ENCOUNTER — Ambulatory Visit (INDEPENDENT_AMBULATORY_CARE_PROVIDER_SITE_OTHER): Payer: BC Managed Care – PPO | Admitting: Family Medicine

## 2020-03-09 ENCOUNTER — Encounter: Payer: Self-pay | Admitting: Family Medicine

## 2020-03-09 ENCOUNTER — Other Ambulatory Visit: Payer: Self-pay

## 2020-03-09 VITALS — BP 106/78 | HR 86 | Temp 97.6°F | Wt 173.0 lb

## 2020-03-09 DIAGNOSIS — M7552 Bursitis of left shoulder: Secondary | ICD-10-CM | POA: Insufficient documentation

## 2020-03-09 NOTE — Assessment & Plan Note (Signed)
Given steroid shot 03/09/2020

## 2020-03-09 NOTE — Progress Notes (Signed)
Established patient visit   [name]   DOB: November 06, 1970   49 y.o. Female  MRN: [id number] Visit Date: 03/09/2020  Today's healthcare provider: Shirlee Latch, MD   Chief Complaint  Patient presents with  . Shoulder Pain    Left shoulder    Subjective    HPI HPI    Shoulder Pain     Additional comments: Left shoulder        Last edited by Paschal Dopp, CMA on 03/09/2020  3:36 PM. (History)      Janice Strickland broke her left shoulder around 12 years ago. Ever since then she has had on and off dull shoulder pain, increasing how in frequency and duration. For the past 4-5 months, this pain has been persistent. It is predominantly at the top of the shoulder, and radiates to the elbow when lifting the arm. Now the pain is a 6-7/10, "pretty intense".   Pain is worse with movement, especially at the side, and with lifting. She has tried icy hot, voltaren gel, tylenol, and ice, which do not help with the pain. She has pain when laying on the left shoulder when sleeping.  She endorses a history of numbness and tingling in both arms/hands/feet/and face. She has seen a neurologist and was started on cymbalta, which has helped this tingling. She received a brain MRI which was normal. Se will see a different neurologist on 03/30/2020.  Of note, patient was noted to have some kyphosis and possible compression fracture on x-ray when hospitalized for covid in April of 2021. Follow-up bone density scan showed T-score of -0.6 at the L femur neck. L3 and L4 was excluded from any reports due to degenerative changes.  Patient Active Problem List   Diagnosis Date Noted  . Bursitis of left shoulder 03/09/2020  . Paresthesia 02/13/2020  . Weakness 02/13/2020  . Pneumonia due to COVID-19 virus 10/17/2019  . RA (rheumatoid arthritis) (HCC)   . Encounter for screening colonoscopy   . Climacteric 07/01/2015   Past Medical History:  Diagnosis Date  . Motion sickness    back seat  - cars  . Numbness and tingling   . Trochanteric bursitis of left hip   . Vertigo    during seasonal allergy season   Past Surgical History:  Procedure Laterality Date  . ABDOMINAL HYSTERECTOMY  2003  . bunion removal  06/28/2018   right foot  . BUNIONECTOMY    . COLONOSCOPY WITH PROPOFOL N/A 05/08/2019   Procedure: COLONOSCOPY WITH PROPOFOL;  Surgeon: Pasty Spillers, MD;  Location: [facility];  Service: Endoscopy;  Laterality: N/A;  . TONSILLECTOMY Bilateral 05/24/2019   Procedure: TONSILLECTOMY;  Surgeon: Linus Salmons, MD;  Location: [facility];  Service: ENT;  Laterality: Bilateral;  . TONSILLECTOMY         Medications: Outpatient Medications Prior to Visit  Medication Sig  . DULoxetine (CYMBALTA) 30 MG capsule TAKE 1 CAPSULE BY MOUTH EVERY DAY  . estradiol (ESTRACE) 1 MG tablet Take 1 mg by mouth daily.  . hydroxychloroquine (PLAQUENIL) 200 MG tablet Take 200-400 mg by mouth daily. Alternates 200mg  with 400mg  every other day.   No facility-administered medications prior to visit.    Review of Systems  Musculoskeletal: Positive for arthralgias.  Neurological: Positive for weakness and numbness.    Last CBC Lab Results  Component Value Date   WBC 7.3 10/21/2019   HGB 14.1 10/21/2019   HCT 42.4 10/21/2019   MCV 94.9 10/21/2019  MCH 31.5 10/21/2019   RDW 12.0 10/21/2019   PLT 336 10/21/2019   Last metabolic panel Lab Results  Component Value Date   GLUCOSE 148 (H) 10/21/2019   NA 136 10/21/2019   K 4.2 10/21/2019   CL 104 10/21/2019   CO2 25 10/21/2019   BUN 20 10/21/2019   CREATININE 0.66 10/21/2019   GFRNONAA >60 10/21/2019   GFRAA >60 10/21/2019   CALCIUM 8.6 (L) 10/21/2019   PROT 6.6 10/21/2019   ALBUMIN 3.3 (L) 10/21/2019   LABGLOB 2.5 04/18/2019   AGRATIO 1.7 04/18/2019   BILITOT 0.5 10/21/2019   ALKPHOS 91 10/21/2019   AST 20 10/21/2019   ALT 28 10/21/2019   ANIONGAP 7 10/21/2019   Last thyroid functions Lab Results    Component Value Date   TSH 2.360 04/18/2019   Last vitamin D No results found for: 25OHVITD2, 25OHVITD3, VD25OH    Objective    BP 106/78 (BP Location: Left Arm, Patient Position: Sitting, Cuff Size: Large)   Pulse 86   Temp 97.6 F (36.4 C) (Oral)   Wt 173 lb (78.5 kg)   SpO2 98%   BMI 28.79 kg/m  Wt Readings from Last 3 Encounters:  03/09/20 173 lb (78.5 kg)  02/13/20 173 lb 8 oz (78.7 kg)  12/19/19 172 lb (78 kg)      Physical Exam Constitutional:      General: She is not in acute distress.    Appearance: She is not ill-appearing or toxic-appearing.  HENT:     Head: Normocephalic and atraumatic.     Right Ear: External ear normal.     Left Ear: External ear normal.  Eyes:     General: No scleral icterus.       Right eye: No discharge.        Left eye: No discharge.     Conjunctiva/sclera: Conjunctivae normal.     Pupils: Pupils are equal, round, and reactive to light.  Cardiovascular:     Rate and Rhythm: Normal rate and regular rhythm.     Heart sounds: Normal heart sounds. No murmur heard.   Pulmonary:     Effort: Pulmonary effort is normal. No respiratory distress.     Breath sounds: Normal breath sounds. No wheezing.  Musculoskeletal:        General: Tenderness present. Normal range of motion.     Right shoulder: Normal.     Left shoulder: Tenderness present. Normal range of motion. Normal strength.     Cervical back: Normal range of motion and neck supple. No tenderness.     Comments: Left shoulder with no gross deformities. Tenderness to palpation over coracoid process and subacromial space. Normal active and passive range of motion and normal strength. Pain with lateral abduction ~90 degrees. Positive Hawkins test. Negative empty can and O'Brien tests.  Skin:    General: Skin is warm.  Neurological:     General: No focal deficit present.     Mental Status: She is alert.     Motor: No weakness.  Psychiatric:        Mood and Affect: Mood normal.         Behavior: Behavior normal.        Thought Content: Thought content normal.        Judgment: Judgment normal.     No results found for any visits on 03/09/20.  Assessment & Plan     1. Bursitis of left shoulder This chronic left shoulder pain with intact ROM  and rotator cuff strength is most consistent with bursitis. Pain with abduction caused by impingement - she has already tried supportive OTC measures  - Steroid shot - see procedure note - Advised on exercises to complete daily once shoulder pain improves - HEP given - If no improvement in a week, call for follow-up appointment and consider imaging/ortho referral  INJECTION: Patient was given informed consent,. Appropriate time out was taken. Area prepped and draped in usual sterile fashion. 1 cc of depo-medrol 40 mg/ml plus  4 cc of 1% lidocaine without epinephrine was injected into the L shoulder using a(n) posterior, subacromial approach. The patient tolerated the procedure well. There were no complications. Post procedure instructions were given.    Return if symptoms worsen or fail to improve.      Higinio Roger, MS3    Patient seen along with MS3 student Higinio Roger. I personally evaluated this patient along with the student, and verified all aspects of the history, physical exam, and medical decision making as documented by the student. I agree with the student's documentation and have made all necessary edits.  Finnigan Warriner, Marzella Schlein, MD, MPH [facility]

## 2020-03-24 DIAGNOSIS — H5201 Hypermetropia, right eye: Secondary | ICD-10-CM | POA: Diagnosis not present

## 2020-03-24 DIAGNOSIS — H18521 Epithelial (juvenile) corneal dystrophy, right eye: Secondary | ICD-10-CM | POA: Diagnosis not present

## 2020-03-24 DIAGNOSIS — Z79899 Other long term (current) drug therapy: Secondary | ICD-10-CM | POA: Diagnosis not present

## 2020-03-24 DIAGNOSIS — H524 Presbyopia: Secondary | ICD-10-CM | POA: Diagnosis not present

## 2020-03-30 ENCOUNTER — Ambulatory Visit (INDEPENDENT_AMBULATORY_CARE_PROVIDER_SITE_OTHER): Payer: BC Managed Care – PPO | Admitting: Neurology

## 2020-03-30 ENCOUNTER — Encounter: Payer: Self-pay | Admitting: Neurology

## 2020-03-30 ENCOUNTER — Other Ambulatory Visit: Payer: Self-pay

## 2020-03-30 VITALS — BP 100/62 | HR 106 | Resp 18 | Ht 65.0 in | Wt 169.0 lb

## 2020-03-30 DIAGNOSIS — R531 Weakness: Secondary | ICD-10-CM

## 2020-03-30 DIAGNOSIS — R202 Paresthesia of skin: Secondary | ICD-10-CM | POA: Diagnosis not present

## 2020-03-30 NOTE — Progress Notes (Signed)
[facility] Clinic Note - Initial Visit   Date: 03/30/20  Janice Strickland MRN: [id number] DOB: [birth year]   Dear Elpidio Anis, PA-C:  Thank you for your kind referral of Janice Strickland for consultation of leg pain and paresthesias. Although her history is well known to you, please allow [place] to reiterate it for the purpose of our medical record. The patient was accompanied to the clinic by self.  History of Present Illness: Janice Strickland is a 49 y.o. right-handed female with interstitial granulomatous dermatitis and COVID (10/2019)  presenting for evaluation of generalized weakness and paresthesias. For the past 10 years, she has spells of numbness/tingling in face, arms, and legs, bilateral leg weakness, and muscle cramps.  Symptoms are variable and can lasts a few days for up to a month and can go several months without having this again.  Starting in March, she began having generalized weakness especially with climbing stairs, word-finding difficulty, and leg cramps. The only trigger she has noticed is heat tends to intensify symptoms.  She saw Dr. Terrace Arabia at [facility] in August 2021 who ordered MRI brain and cervical spine which I have personally viewed and is normal.  She was started on Cymbalta and has appreciated that pain has improved, but not resolved.  She is here for second opinion.   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  She is able to drive, manage household chores, finances, and medications.  She feels fatigued and exhausted.  She has insomnia.   She lives at home with husband.  Highest level of education is high school.  Out-side paper records, electronic medical record, and images have been reviewed where available and summarized as:  MRI brain and cervical spine wo contrast 02/27/2020:  Normal  Lab Results  Component Value Date   TSH 2.360 04/18/2019    Past Medical History:  Diagnosis Date  . Motion sickness    back seat - cars  . Numbness and tingling     . Trochanteric bursitis of left hip   . Vertigo    during seasonal allergy season    Past Surgical History:  Procedure Laterality Date  . ABDOMINAL HYSTERECTOMY  2003  . bunion removal  06/28/2018   right foot  . BUNIONECTOMY    . COLONOSCOPY WITH PROPOFOL N/A 05/08/2019   Procedure: COLONOSCOPY WITH PROPOFOL;  Surgeon: Pasty Spillers, MD;  Location: [facility];  Service: Endoscopy;  Laterality: N/A;  . TONSILLECTOMY Bilateral 05/24/2019   Procedure: TONSILLECTOMY;  Surgeon: Linus Salmons, MD;  Location: [facility];  Service: ENT;  Laterality: Bilateral;  . TONSILLECTOMY       Medications:  Outpatient Encounter Medications as of 03/30/2020  Medication Sig  . DULoxetine (CYMBALTA) 30 MG capsule TAKE 1 CAPSULE BY MOUTH EVERY DAY  . estradiol (ESTRACE) 1 MG tablet Take 1 mg by mouth daily.  . hydroxychloroquine (PLAQUENIL) 200 MG tablet Take 200-400 mg by mouth daily. Alternates 200mg  with 400mg  every other day.   No facility-administered encounter medications on file as of 03/30/2020.    Allergies: No Known Allergies  Family History: Family History  Problem Relation Age of Onset  . Hypertension Mother   . Hypothyroidism Mother   . Heart disease Mother   . Healthy Father   . Hypertension Brother   . Breast cancer Neg Hx     Social History: Social History   Tobacco Use  . Smoking status: Never Smoker  . Smokeless tobacco: Never Used  Vaping Use  .  Vaping Use: Never used  Substance Use Topics  . Alcohol use: Yes    Comment: may have a drink on Holidays  . Drug use: Never   Social History   Social History Narrative   Lives at home with family.   Right-handed.   At most, one cup caffeine daily.    Vital Signs:  BP 100/62   Pulse (!) 106   Resp 18   Ht 5\' 5"  (1.651 m)   Wt 169 lb (76.7 kg)   SpO2 96%   BMI 28.12 kg/m    Neurological Exam: MENTAL STATUS including orientation to place, person, recent and remote memory, attention  span and concentration, language, and fund of knowledge is normal.  Speech is not dysarthric. Month is August, number date 38, year 2021, day of the week Monday.  She is able to correctly say serial presidents backwards  CRANIAL NERVES: II:  No visual field defects. III-IV-VI: Pupils equal round and reactive to light.  Normal conjugate, extra-ocular eye movements in all directions of gaze.  No nystagmus.  No ptosis.   V:  Normal facial sensation.    VII:  Normal facial symmetry and movements.   VIII:  Normal hearing and vestibular function.   IX-X:  Normal palatal movement.   XI:  Normal shoulder shrug and head rotation.   XII:  Normal tongue strength and range of motion, no deviation or fasciculation.  MOTOR:  No atrophy, fasciculations or abnormal movements.  No pronator drift.   Upper Extremity:  Right  Left  Deltoid  5/5   5/5   Biceps  5/5   5/5   Triceps  5/5   5/5   Infraspinatus 5/5  5/5  Medial pectoralis 5/5  5/5  Wrist extensors  5/5   5/5   Wrist flexors  5/5   5/5   Finger extensors  5/5   5/5   Finger flexors  5/5   5/5   Dorsal interossei  5/5   5/5   Abductor pollicis  5/5   5/5   Tone (Ashworth scale)  0  0   Lower Extremity:  Right  Left  Hip flexors  5/5   5/5   Hip extensors  5/5   5/5   Adductor 5/5  5/5  Abductor 5/5  5/5  Knee flexors  5/5   5/5   Knee extensors  5/5   5/5   Dorsiflexors  5/5   5/5   Plantarflexors  5/5   5/5   Toe extensors  5/5   5/5   Toe flexors  5/5   5/5   Tone (Ashworth scale)  0  0   MSRs:  Right        Left                  brachioradialis 2+  2+  biceps 2+  2+  triceps 2+  2+  patellar 2+  2+  ankle jerk 2+  2+  Hoffman no  no  plantar response down  down   SENSORY:  Normal and symmetric perception of light touch, pinprick, vibration, and proprioception.  Romberg's sign absent.   COORDINATION/GAIT: Normal finger-to- nose-finger and heel-to-shin.  Intact rapid alternating movements bilaterally.  Able to rise from a  chair without using arms.  Gait narrow based and stable. Tandem and stressed gait intact.    IMPRESSION: Constellation of symptoms including generalized weakness, paresthesias, cramps.  She a normal neurological  exam and normal MRI  brain and cervical spine, both which is very reassuring.  I offered NCS/EMG and/or memory testing, but also discussed that my overall suspicion for primary neurological condition is very low given that MS has been excluded.  Testing was declined.  I will check vitamin B12 level to be complete.  With her widespread pain which has slightly improved with Cymbalta, fibromyalgia is considered.  I will refer her for out-patient PT for leg conditioning.  Recommend follow-up with PCP/rheumatology for evaluation of fibromyalgia.   Return to clinic as needed   Thank you for allowing me to participate in patient's care.  If I can answer any additional questions, I would be pleased to do so.    Sincerely,    Javante Nilsson K. Allena Katz, DO

## 2020-03-30 NOTE — Patient Instructions (Addendum)
Check vitamin B12 level  Start physical therapy for leg strengthening  If you choose to proceed with nerve or memory testing, please call my office

## 2020-04-06 DIAGNOSIS — M797 Fibromyalgia: Secondary | ICD-10-CM | POA: Diagnosis not present

## 2020-04-06 DIAGNOSIS — M6281 Muscle weakness (generalized): Secondary | ICD-10-CM | POA: Diagnosis not present

## 2020-04-06 DIAGNOSIS — M79604 Pain in right leg: Secondary | ICD-10-CM | POA: Diagnosis not present

## 2020-04-10 DIAGNOSIS — M6281 Muscle weakness (generalized): Secondary | ICD-10-CM | POA: Diagnosis not present

## 2020-04-10 DIAGNOSIS — M79604 Pain in right leg: Secondary | ICD-10-CM | POA: Diagnosis not present

## 2020-04-10 DIAGNOSIS — M797 Fibromyalgia: Secondary | ICD-10-CM | POA: Diagnosis not present

## 2020-04-13 DIAGNOSIS — M797 Fibromyalgia: Secondary | ICD-10-CM | POA: Diagnosis not present

## 2020-04-13 DIAGNOSIS — M6281 Muscle weakness (generalized): Secondary | ICD-10-CM | POA: Diagnosis not present

## 2020-04-13 DIAGNOSIS — M79604 Pain in right leg: Secondary | ICD-10-CM | POA: Diagnosis not present

## 2020-04-15 DIAGNOSIS — M6281 Muscle weakness (generalized): Secondary | ICD-10-CM | POA: Diagnosis not present

## 2020-04-15 DIAGNOSIS — M797 Fibromyalgia: Secondary | ICD-10-CM | POA: Diagnosis not present

## 2020-04-15 DIAGNOSIS — M79604 Pain in right leg: Secondary | ICD-10-CM | POA: Diagnosis not present

## 2020-04-27 DIAGNOSIS — M6281 Muscle weakness (generalized): Secondary | ICD-10-CM | POA: Diagnosis not present

## 2020-04-27 DIAGNOSIS — M797 Fibromyalgia: Secondary | ICD-10-CM | POA: Diagnosis not present

## 2020-04-27 DIAGNOSIS — M79604 Pain in right leg: Secondary | ICD-10-CM | POA: Diagnosis not present

## 2020-04-30 DIAGNOSIS — L308 Other specified dermatitis: Secondary | ICD-10-CM | POA: Diagnosis not present

## 2020-04-30 DIAGNOSIS — R202 Paresthesia of skin: Secondary | ICD-10-CM | POA: Diagnosis not present

## 2020-04-30 DIAGNOSIS — R29898 Other symptoms and signs involving the musculoskeletal system: Secondary | ICD-10-CM | POA: Diagnosis not present

## 2020-04-30 DIAGNOSIS — M2550 Pain in unspecified joint: Secondary | ICD-10-CM | POA: Diagnosis not present

## 2020-06-15 ENCOUNTER — Other Ambulatory Visit: Payer: Self-pay | Admitting: Obstetrics and Gynecology

## 2020-11-06 IMAGING — MG DIGITAL SCREENING BILATERAL MAMMOGRAM WITH TOMO AND CAD
8 series · 8 of 24 positions shown · non-contrast
Comparison: Previous exam(s).

CLINICAL DATA: Screening.

EXAM:
DIGITAL SCREENING BILATERAL MAMMOGRAM WITH TOMO AND CAD

[L MLO synth-2D]
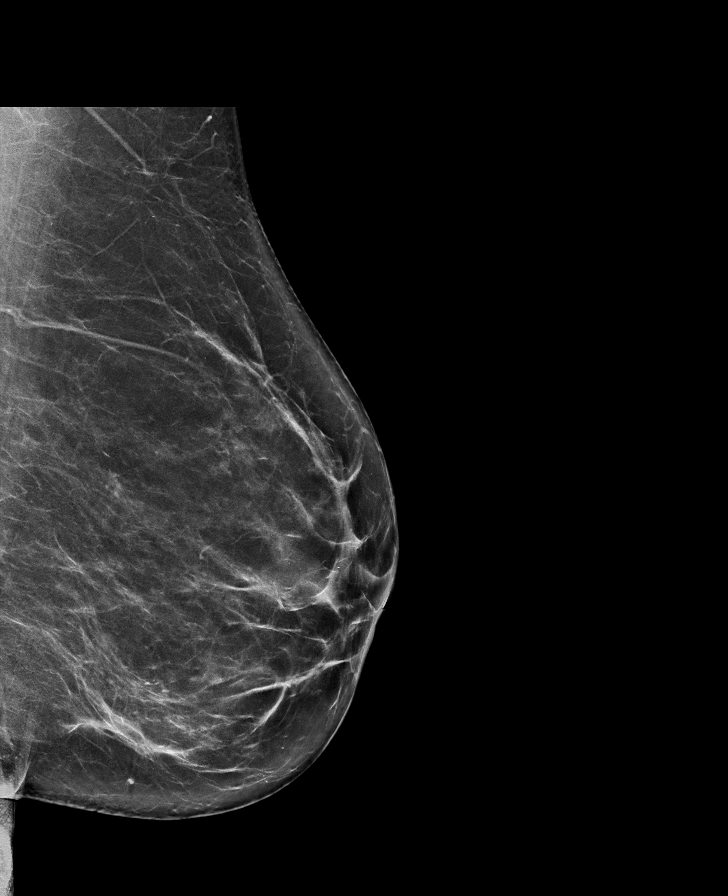

[R MLO synth-2D]
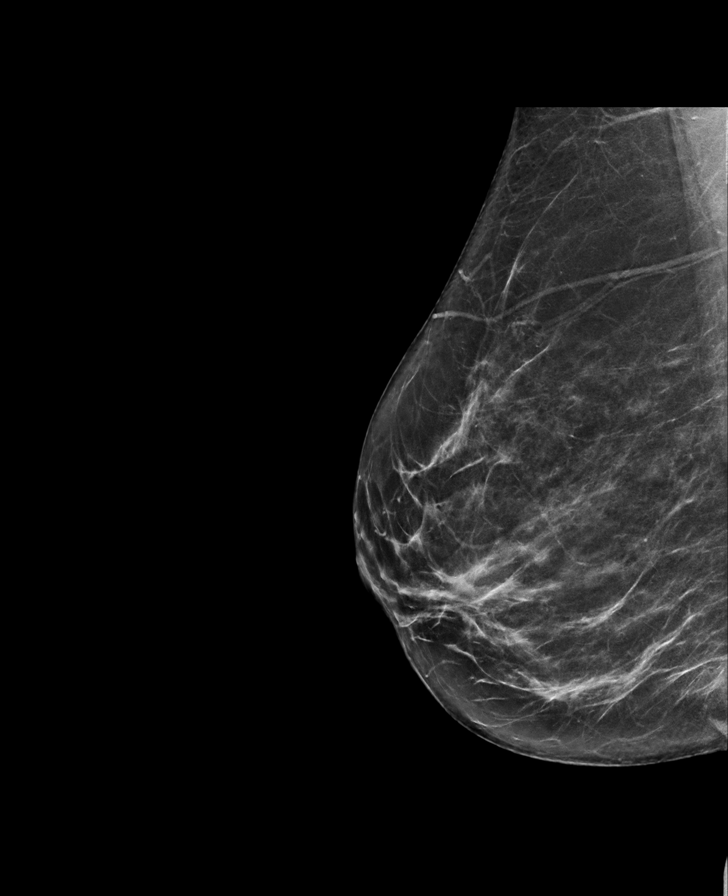

[R CC synth-2D]
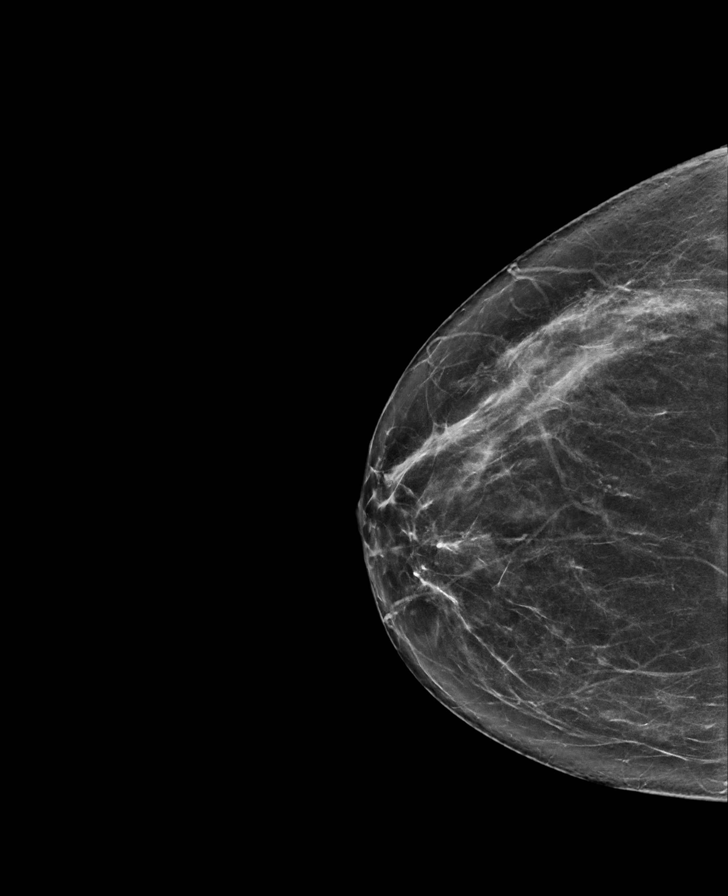

[L CC synth-2D]
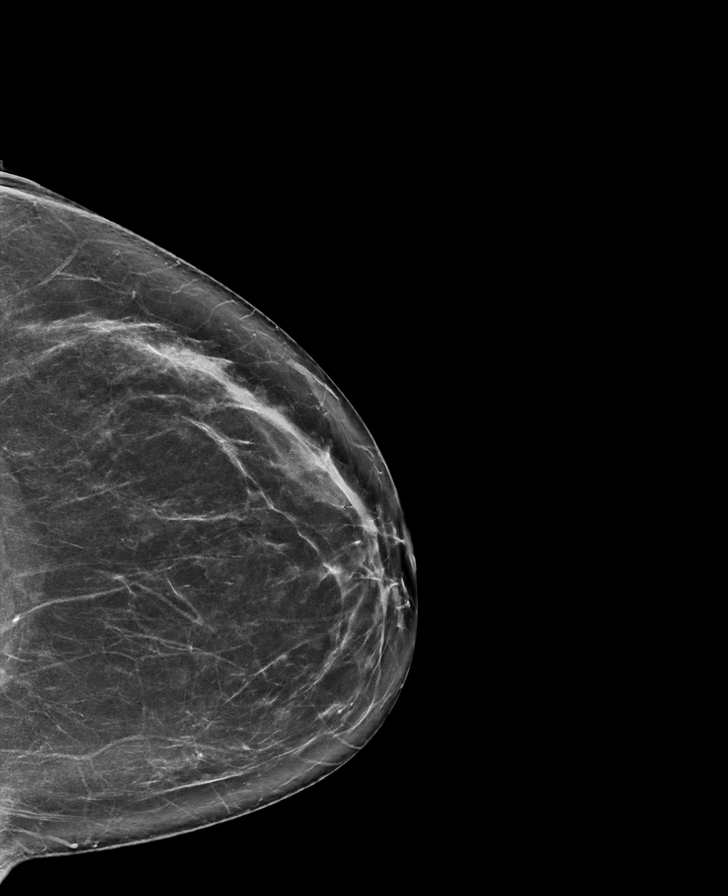

[R CC tomo · tomo slice 38/75.0]
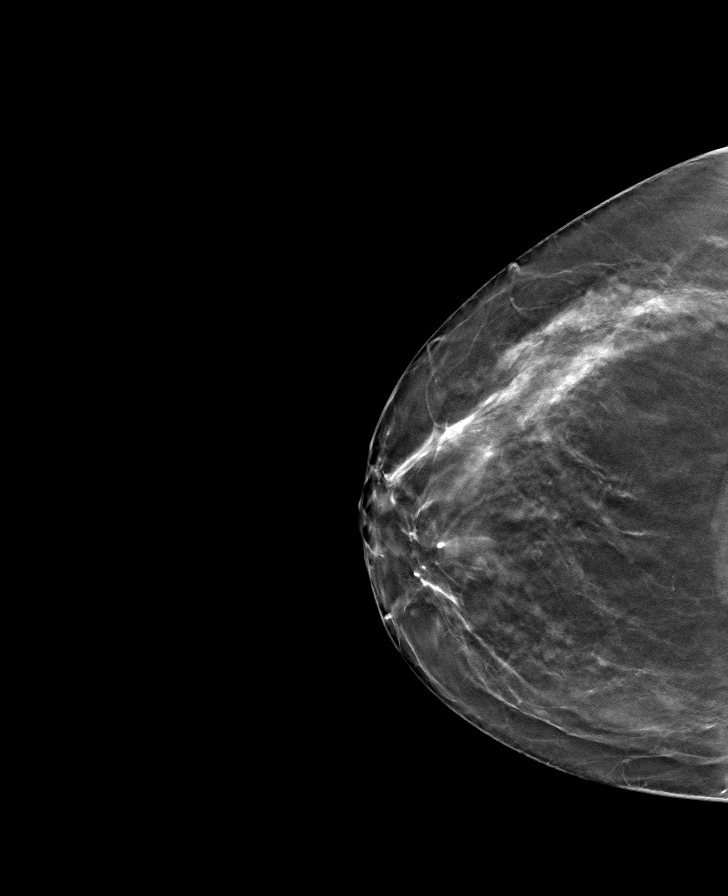

[L MLO tomo · tomo slice 41/80.0]
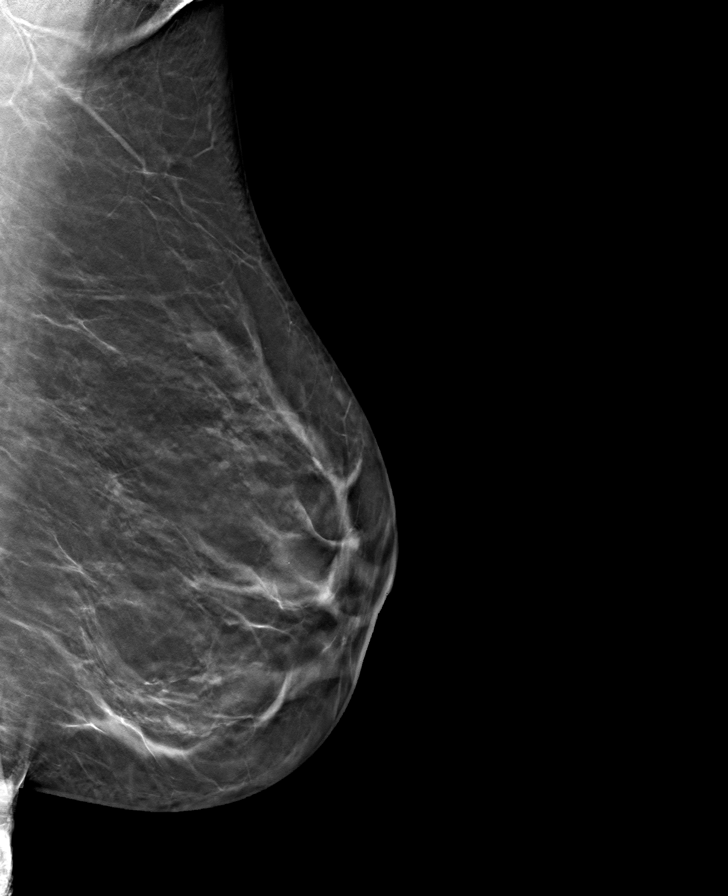

[R MLO tomo · tomo slice 39/76.0]
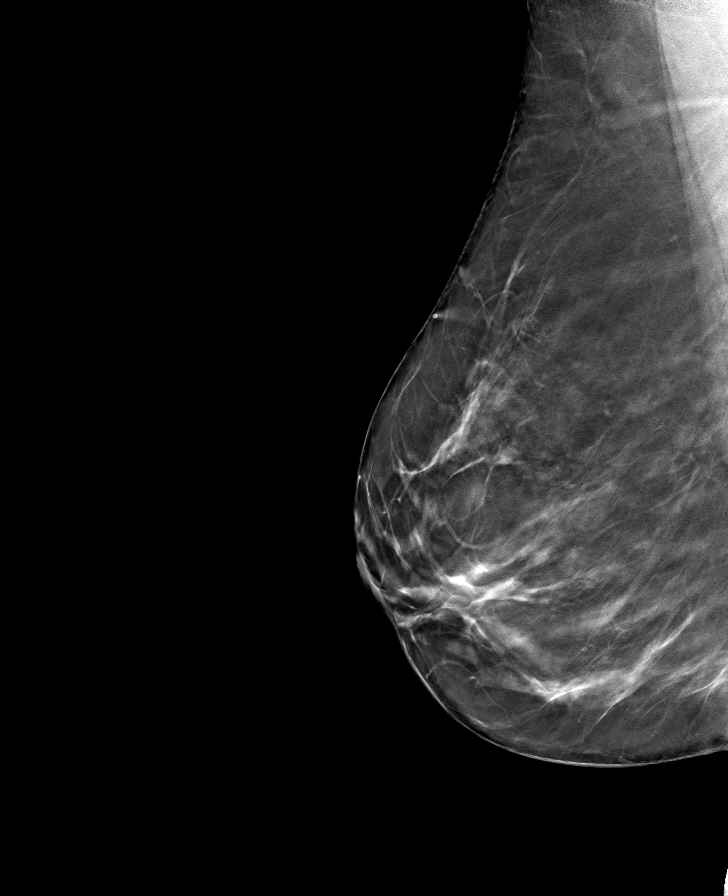

[L CC tomo · tomo slice 39/78.0]
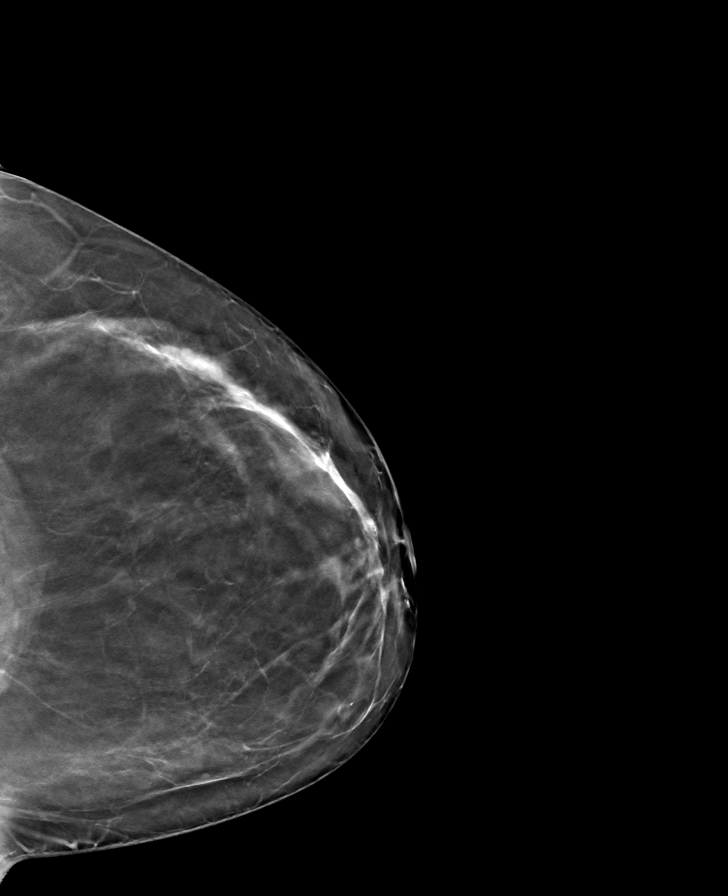

[8 of 24 positions shown; findings below may reference images not displayed]

ACR Breast Density Category b: There are scattered areas of
fibroglandular density.
FINDINGS: There are no findings suspicious for malignancy. Images were
processed with CAD.
IMPRESSION: No mammographic evidence of malignancy. A result letter of this
screening mammogram will be mailed directly to the patient.

RECOMMENDATION:
Screening mammogram in one year. (Code:CN-U-775)

BI-RADS CATEGORY  1: Negative.

## 2021-07-30 IMAGING — CR DG CHEST 2V
1 series · 2 of 2 positions shown · non-contrast
Comparison: 10/17/2019

CLINICAL DATA: Mid RIGHT-side chest pain, LEFT upper lobe nodule,
hospitalized for COVID October 2019

EXAM:
CHEST - 2 VIEW

[Series 1: dg chest 2 view · 0.14mm/px · 2 of 2 slices shown]
[im 1/2]
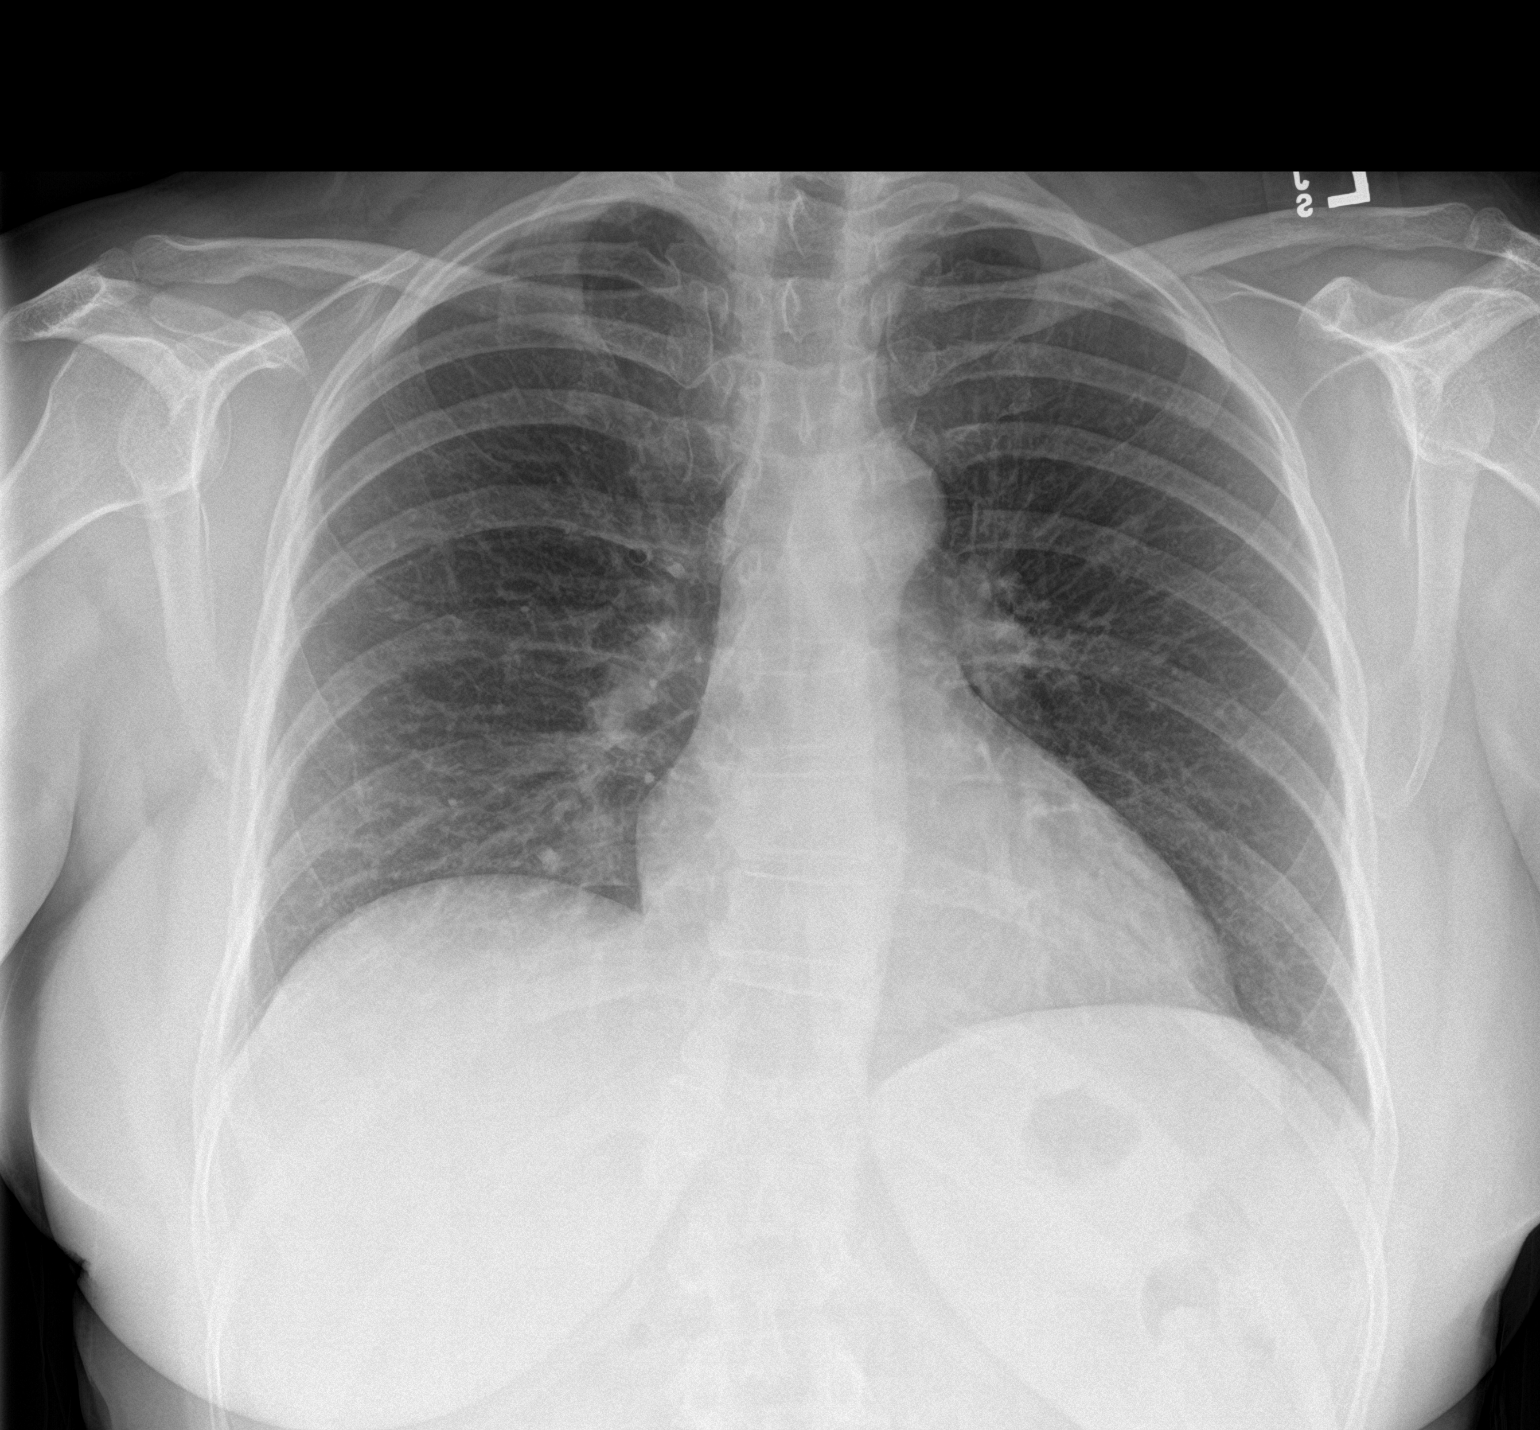
[im 2/2]
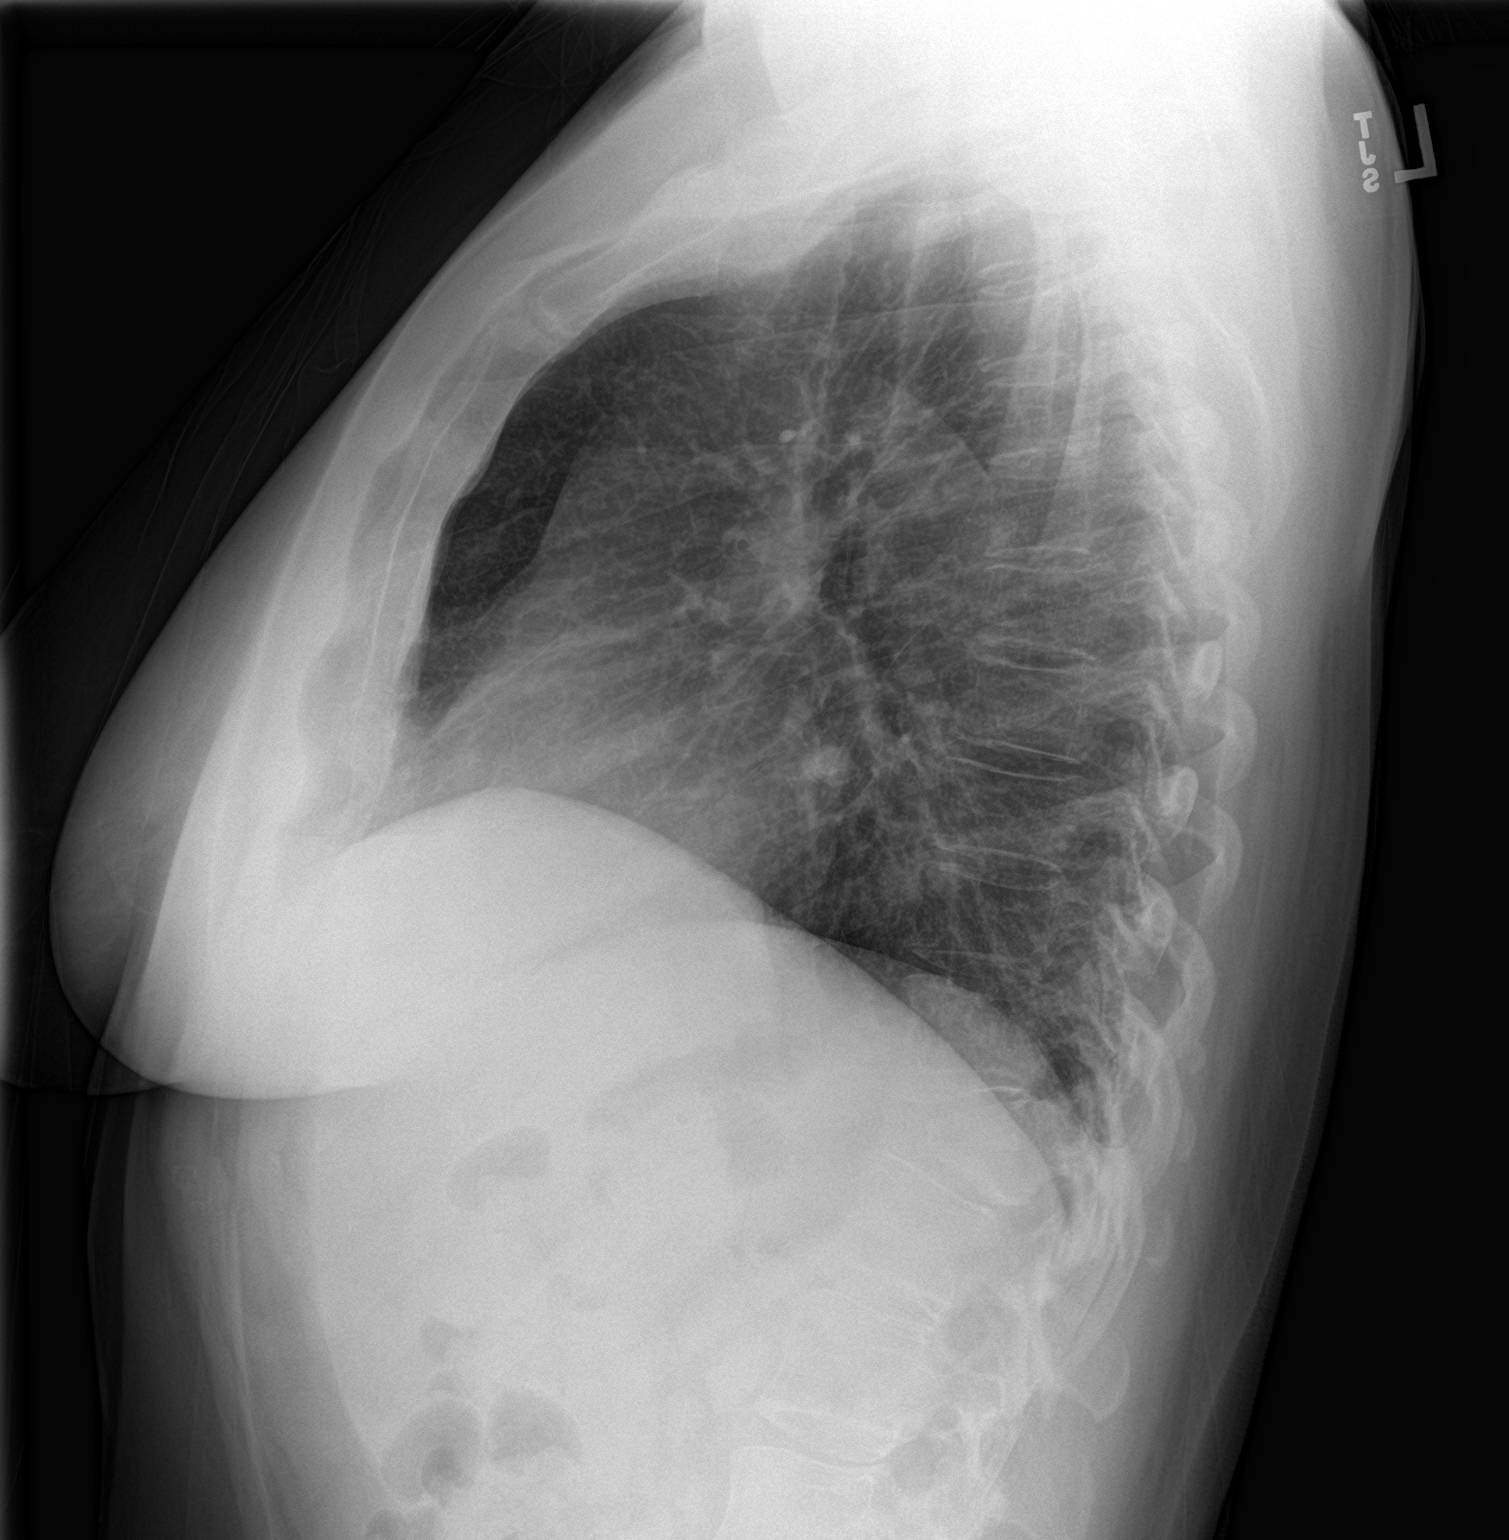

[2 of 2 positions shown; findings below may reference images not displayed]

FINDINGS: Normal heart size, mediastinal contours, and pulmonary vascularity.

Lungs clear.

No pleural effusion or pneumothorax.

Chronic anterior height loss of T12 vertebral body.
IMPRESSION: No acute abnormalities.

No pulmonary nodules identified.
